# Patient Record
Sex: Female | Born: 1980 | Race: White | Hispanic: No | Marital: Single | Smoking: Never smoker
Health system: Southern US, Community
[De-identification: ages and names within clinical notes are randomized; demographics above are authoritative.]

## PROBLEM LIST (undated history)

## (undated) DIAGNOSIS — N200 Calculus of kidney: Secondary | ICD-10-CM

## (undated) DIAGNOSIS — G43909 Migraine, unspecified, not intractable, without status migrainosus: Secondary | ICD-10-CM

## (undated) HISTORY — PX: TUBAL LIGATION: SHX77

## (undated) HISTORY — PX: BREAST SURGERY: SHX581

---
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
## 2001-11-27 ENCOUNTER — Ambulatory Visit (HOSPITAL_COMMUNITY): Admission: RE | Admit: 2001-11-27 | Discharge: 2001-11-27 | Payer: Self-pay | Admitting: *Deleted

## 2001-11-27 ENCOUNTER — Encounter: Payer: Self-pay | Admitting: *Deleted

## 2001-12-30 ENCOUNTER — Ambulatory Visit (HOSPITAL_COMMUNITY): Admission: RE | Admit: 2001-12-30 | Discharge: 2001-12-31 | Payer: Self-pay | Admitting: *Deleted

## 2002-01-30 ENCOUNTER — Inpatient Hospital Stay (HOSPITAL_COMMUNITY): Admission: AD | Admit: 2002-01-30 | Discharge: 2002-02-02 | Payer: Self-pay | Admitting: *Deleted

## 2002-03-08 ENCOUNTER — Ambulatory Visit (HOSPITAL_COMMUNITY): Admission: RE | Admit: 2002-03-08 | Discharge: 2002-03-08 | Payer: Self-pay | Admitting: *Deleted

## 2002-05-13 ENCOUNTER — Encounter: Payer: Self-pay | Admitting: *Deleted

## 2002-05-13 ENCOUNTER — Emergency Department (HOSPITAL_COMMUNITY): Admission: EM | Admit: 2002-05-13 | Discharge: 2002-05-13 | Payer: Self-pay | Admitting: *Deleted

## 2002-07-19 ENCOUNTER — Encounter: Payer: Self-pay | Admitting: Emergency Medicine

## 2002-07-19 ENCOUNTER — Emergency Department (HOSPITAL_COMMUNITY): Admission: EM | Admit: 2002-07-19 | Discharge: 2002-07-19 | Payer: Self-pay | Admitting: Emergency Medicine

## 2002-09-26 ENCOUNTER — Emergency Department (HOSPITAL_COMMUNITY): Admission: EM | Admit: 2002-09-26 | Discharge: 2002-09-27 | Payer: Self-pay | Admitting: Emergency Medicine

## 2002-12-01 ENCOUNTER — Emergency Department (HOSPITAL_COMMUNITY): Admission: EM | Admit: 2002-12-01 | Discharge: 2002-12-01 | Payer: Self-pay | Admitting: *Deleted

## 2003-06-04 ENCOUNTER — Emergency Department (HOSPITAL_COMMUNITY): Admission: EM | Admit: 2003-06-04 | Discharge: 2003-06-05 | Payer: Self-pay | Admitting: *Deleted

## 2003-06-05 ENCOUNTER — Encounter: Payer: Self-pay | Admitting: *Deleted

## 2003-06-27 ENCOUNTER — Emergency Department (HOSPITAL_COMMUNITY): Admission: EM | Admit: 2003-06-27 | Discharge: 2003-06-27 | Payer: Self-pay | Admitting: Emergency Medicine

## 2004-06-04 ENCOUNTER — Emergency Department (HOSPITAL_COMMUNITY): Admission: EM | Admit: 2004-06-04 | Discharge: 2004-06-04 | Payer: Self-pay | Admitting: Family Medicine

## 2005-06-23 ENCOUNTER — Emergency Department (HOSPITAL_COMMUNITY): Admission: EM | Admit: 2005-06-23 | Discharge: 2005-06-23 | Payer: Self-pay | Admitting: Emergency Medicine

## 2008-07-28 ENCOUNTER — Emergency Department (HOSPITAL_COMMUNITY): Admission: EM | Admit: 2008-07-28 | Discharge: 2008-07-28 | Payer: Self-pay | Admitting: Family Medicine

## 2011-01-15 NOTE — H&P (Signed)
[facility]  Patient:    [name] Koch, [name] Koch Visit Number: [id number] MRN: [id number]          Service Type: DSU Location: DAY Attending Physician:  Jeri Cos. Dictated by:   Langley Gauss, M.D. Admit Date:  03/08/2002 Discharge Date: 03/08/2002                           History and Physical  HISTORY OF PRESENT ILLNESS:  This is a 30 year old, gravida 2, para 2, who adamantly desires permanent sterilization.  She accepts the 2% inherent failure rate associated with the procedure.  She also understands that it is considered to be considered an irreversible procedure.  The patient also understands that there are other reversible forms of birth control available but she desires permanent sterilization at this time.  PAST MEDICAL HISTORY:  She has no significant prior medical history.  PAST SURGICAL HISTORY:  The patient does have a history of a kidney stone. She does have a prior history of anemia.  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  ALLERGIES:  She has no known drug allergies.  PHYSICAL EXAMINATION:  VITAL SIGNS:  Height is 5 foot 4.  Weight is 134 pounds.  Blood pressure 116/63, pulse rate of 80, respiratory rate 20.  HEENT:  Negative.  NECK:  No adenopathy.  Neck is supple.  Thyroids are not palpable.  LUNGS:  Clear.  CARDIOVASCULAR:  Regular rate and rhythm.  ABDOMEN:  Soft and nontender.  No surgical scars are identified.  EXTREMITIES:  Normal.  PELVIC:  Normal external genitalia.  No lesions or ulcerations identified. Cervix is noted to be without lesions.  Bimanual examination reveals anteflex normal size uterus with no adnexal masses.  No significant pelvic tenderness on examination.  ASSESSMENT:  The patient desires permanent sterilization.  PLAN:  We will proceed with laparoscopic tubal ligation utilizing bipolar cautery on March 08, 2002. Dictated by:   Langley Gauss, M.D. Attending Physician:   Jeri Cos. DD:  03/09/02 TD:  03/12/02 Job: [id number]

## 2011-01-15 NOTE — Discharge Summary (Signed)
[facility]  Patient:    [name] Koch, [name] Koch Visit Number: [id number] MRN: [id number]          Service Type: MED Location: 4A A419 01 Attending Physician:  Jeri Cos. Dictated by:   Langley Gauss, M.D. Admit Date:  01/30/2002 Discharge Date: 02/02/2002                             Discharge Summary  DATE OF EPIDURAL AND DELIVERY:  January 31, 2002  DISCHARGE INSTRUCTIONS:  The patient is given a copy of standard discharge instructions at time of discharge.  FOLLOWUP:  She will follow up in the office in 4 weeks time.  She is interested in discussing permanent sterilization at that time.  PERTINENT LABORATORY STUDIES:  Pertinent laboratory studies include admission hemoglobin and hematocrit of 10/29; postpartum day #1-1/2 - 8.9/26.  HOSPITAL COURSE:  See previous dictations.  The patient delivered on January 31, 2002.  She had an epidural catheter in place at time of delivery.  Delivery itself was uncomplicated.  The patient did well postpartum, bonded well with the infant, thus mother and infant were discharged to home on postpartum day #1-1/2.  Of note, the patient discharged on postpartum day #1-1/2 in my absence according to crosscovered arrangement with Dr. Christin Bach. Dictated by:   Langley Gauss, M.D. Attending Physician:  Jeri Cos. DD:  02/10/02 TD:  02/13/02 Job: [id number]

## 2011-01-15 NOTE — Op Note (Signed)
[facility]  Patient:    [name] Koch, [name] Koch Visit Number: [id number] MRN: [id number]          Service Type: DSU Location: DAY Attending Physician:  Jeri Cos. Dictated by:   Langley Gauss, M.D. Admit Date:  03/08/2002 Discharge Date: 03/08/2002                             Operative Report  PREOPERATIVE DIAGNOSIS:  Desires permanent sterilization.  POSTOPERATIVE DIAGNOSIS:  Desires permanent sterilization.  PROCEDURE PERFORMED:  Laparoscopic tubal ligation utilizing bipolar cautery.  SURGEON:  Langley Gauss, M.D.  ESTIMATED BLOOD LOSS:  Minimal.  COMPLICATIONS:  None.  SPECIMENS:  None.  FINDINGS AT THE TIME OF SURGERY:  Include a normal anatomy of the pelvis to include normal tubes and ovaries bilaterally. No adhesive disease encountered. There was moderate descent of the uterus, which on traction with the uterosacral ligaments descending to the hymenal ring.  SUMMARY:  Desire for permanent sterilization was confirmed with the patient prior to proceeding. The patient was taken to the operating room.  Vital signs were stable.  The patient underwent uncomplicated induction of general endotracheal anesthesia, after which time she was placed in the low lithotomy position.  She was prepped and draped in the normal manner utilizing Betadine solution.  A speculum was then placed within the vaginal vault. The anterior lip of the cervix was grasped with a single-tooth tenaculum.  A Hulka intrauterine manipulator was then passed through the endocervical os and grasped the cervix at 12 oclock.  The speculum and tenaculum were then removed.  A red rubber catheter was used to drain about 50 cc of clear yellow urine from the bladder.  I then changed to sterile gloves, and standing at the patients left side, a 1 cm vertical incision was made just inferior to the umbilicus, as well as a suprapubic horizontal incision.  The abdominal wall was then  elevated.  The Veress needle was then passed through the abdominal wall and into the intraperitoneal space.  Drop test confirmed proper intraperitoneal placement.  3 cc of carbon dioxide gas were then insufflated at a filling pressure of less than 12 mmHg.  After assurance of adequate pneumoperitoneum, the Veress needle was removed.  The abdominal wall was then elevated.  A 10 mm disposable trocar and sleeve were inserted through this subumbilical incision, angling toward the hila of the sacrum. This was done atraumatically.  Trocar was removed.  The scope was inserted, which confirmed a proper intraperitoneal placement with no apparent bowel or vascular injury. Under direct visualization, a 5 mm trocar was then inserted directly suprapubically.  This was likewise done atraumatically.  This allowed the Klepinger bipolar cauterization forceps to be passed through the suprapubic site.  Each of the tubes was verified by tracing them out to their fimbriated ends.  The fimbriae are identified.  The tubes and ovaries are normal in appearance.  Triple cauterization technique was then utilized with the most proximal being 1 cm from the tubouterine junction on each cauterization. Cautery was continued until extensive tubular destruction resulted, and the cauterizing current fell down to 0.  This then resulted in extensive tubular destruction bilaterally and no apparent bowel or vascular injury; thus, our instruments were removed, and the gas was allowed to escape.  The two incision sites were closed utilizing 3-0 Vicryl suture in a deep interrupted fashion through the fascia, followed by a superficial 3-0 Vicryl suture  to reapproximate the skin edges.  The patient tolerated the procedure very well. She was extubated and taken to the recovery room in stable condition.  No family members are immediately available to discuss operative findings.  DISPOSITION:  The patient was given a copy of  standardized discharge instructions.  Follow up in the office in one weeks time.  DISCHARGE MEDICATIONS:  Include Lortab 10/500, #30 with no refill.  PERTINENT LABORATORY STUDIES:  Include quantitative beta HCG is negative, hemoglobin 12.0, hematocrit 37.2, with a white count of 3.1.  SUMMARY:  The patient had an uncomplicated laparoscopic tubal ligation on March 08, 2002.  She remained afebrile postoperatively and was able to ambulate and void, tolerate a regular, general diet, and remained afebrile.  Thus, the patient was discharged to home on the date of service, March 08, 2002. Dictated by:   Langley Gauss, M.D. Attending Physician:  Jeri Cos. DD:  03/09/02 TD:  03/12/02 Job: [id number]

## 2011-01-15 NOTE — Op Note (Signed)
[facility]  Patient:    [name] Koch, [name] Koch Visit Number: [id number] MRN: [id number]          Service Type: MED Location: 4A A419 01 Attending Physician:  Jeri Cos. Dictated by:   Langley Gauss, M.D. Admit Date:  01/30/2002 Discharge Date: 02/02/2002                             Operative Report  DATE OF DELIVERY:  January 31, 2002  DELIVERY FORM: 1. Outlet vacuum extraction of viable vigorous female infant. 2. Midline episiotomy and repair.  DELIVERY PERFORMED BY:  Langley Gauss, M.D.  ESTIMATED BLOOD LOSS:  Less than 500 cc.  COMPLICATIONS:  None.  SPECIMENS:  Arterial cord gas and cord blood to pathology.  Placenta was examined and noted to be apparently intact with a three-vessel umbilical cord.  SUMMARY:  The patient progressed rapidly through the course of labor after placement of the epidural.  She reached complete dilatation, after which time she did have a strong urge to push.  She was placed in the dorsal lithotomy position.  She pushed well during the second stage of labor with descent of the vertex to the perineal floor.  With distention of the perineum, a small midline episiotomy was performed.  A total of 30 cc of 1% lidocaine were used as a supplemental analgesic in the midline of the perineal body.  After performance of the small midline episiotomy, the patient pushed very well. There was no need to proceed with a vacuum delivery as the patient did deliver spontaneously in a direct OA position over this midline episiotomy without extension.  Mouth and nares of the infant were bulb-suctioned of clear amniotic fluid.  Renewed expulsive efforts resulted in a spontaneous rotation to a left anterior shoulder position.  Gentle downward traction combined with renewed expulsive efforts resulted in delivery of the remainder of the infant without difficulty.  The umbilical cord was then milked towards the infant. Cord was doubly  clamped and cut and infant is handed to the awaiting nursing staff.  Arterial cord gas and cord blood were then obtained.  Gentle traction on the umbilical cord resulted in separation, which upon examination appears to be an intact placenta with associated three-vessel umbilical cord. Excellent uterine tone is achieved following delivery of the placenta.  The episiotomy is easily repaired utilizing 0 chromic in a running-locked fashion on the vaginal mucosa, followed by a two-layer closure of 0 chromic on the perineal body.  The patient tolerated the delivery very well.  She is taken out of the dorsal lithotomy position and rolled to her side, at which time the epidural catheter was removed, with the blue tip noted to be intact, the patient returned to the supine position and both mother and infant doing very well following delivery. Dictated by:   Langley Gauss, M.D. Attending Physician:  Jeri Cos. DD:  02/10/02 TD:  02/13/02 Job: [id number]

## 2011-01-15 NOTE — Op Note (Signed)
[facility]  Patient:    [name] Koch, [name] Koch Visit Number: [id number] MRN: [id number]          Service Type: MED Location: 4A A419 01 Attending Physician:  Jeri Cos. Dictated by:   Langley Gauss, M.D. Proc. Date: 01/31/02 Admit Date:  01/30/2002 Discharge Date: 02/02/2002                             Operative Report  PROCEDURE:  Epidural placement.  SURGEON:  Langley Gauss, M.D.  DESCRIPTION OF PROCEDURE:  Appropriate informed consent had been obtained.  We had discussed epidural analgesic during the prenatal course.  Continuous electronic fetal monitoring was performed.  The patient was placed in a seated position, at which time bony landmarks were identified.  The L3-4 interspace was chosen.  The patients back was sterilely prepped and draped in the usual manner utilizing the epidural kit.  The L3-4 interspace was chosen.  Lidocaine 1% plain 5 cc was injected at the midline of the L3-4 interspace to raise a small skin wheal.  The 17 gauge Tuohy Schliff was then utilized with loss of resistance with an air-filled glass syringe to properly atraumatically identify entry into the epidural space on the first attempt without difficulty.  Excellent loss of resistance was noted.  An initial test dose of 5 cc of 1.5% lidocaine post epinephrine was injected through the epidural needle.  No signs of CSF or intravascular injection were obtained.  Thus, the epidural catheter was inserted to a depth of 5 cm.  The epidural needle was removed.  The aspiration test was negative.  The second test dose with 2 cc of 1.5% lidocaine.  Again no signs of CSF or intravascular injection obtained. Thus, the patient was connected to the infusion pump containing a standard mixture.  She will be treated with a bolus of 10 cc followed by a continuous infusion rate of 14 cc/hr.  Upon return to the left lateral supine position, the patient is having tingling in the feet  with some heaviness with evidence of rapid setting up an appropriate epidural block for labor purposes. Dictated by:   Langley Gauss, M.D. Attending Physician:  Jeri Cos. DD:  02/10/02 TD:  02/13/02 Job: [id number]

## 2011-01-15 NOTE — H&P (Signed)
[facility]  Patient:    [name] Koch, [name] Koch Visit Number: [id number] MRN: [id number]          Service Type: MED Location: 4A A419 01 Attending Physician:  Jeri Cos. Dictated by:   Langley Gauss, M.D. Admit Date:  01/30/2002 Discharge Date: 02/02/2002                           History and Physical  HISTORY:  A 30 year old gravida 2, para 1, at 39-1/[redacted] weeks gestation, who presents to [facility] complaining of an increasing frequency of uterine contractions throughout the day, particularly within the past two hours.  She does state that they have been five minutes apart x 2 hours duration, of one minute intensity.  The patient also complains of increased discharge of mucus-like appearing fluid from the cervix, but denies any rupture of membranes or any vaginal bleeding.  PAST SURGICAL HISTORY:  Negative.  PAST MEDICAL HISTORY:  Negative.  ALLERGIES:  No known drug allergies.  OB HISTORY:  Pertinent for one vaginal delivery without complications.  She states that the labor and delivery with that previous pregnancy, she labored very quickly, and reached complete dilatation, after which time she delivered very quickly.  In fact, the delivery itself was precipitous, and nurse-controlled.  PHYSICAL EXAMINATION  GENERAL:  Noted to be in no acute distress.  VITAL SIGNS:  Height 5 feet 5 inches, weight 156 pounds, blood pressure 130/87, pulse 103, respirations 18, temperature 98.1 degrees.  HEENT:  Negative.  NODES:  No adenopathy.  NECK:  Supple.  Thyroid is not palpable.  LUNGS:  Clear.  CARDIOVASCULAR:  Regular rate and rhythm.  ABDOMEN:  Soft, nontender.  No surgical scars are identified, but the patient does have a gravid uterus with a fundal height measured at 38 cm.  She is vertex presentation by Leopolds maneuvers.  EXTREMITIES:  Noted to be normal, with only trace pretibial edema.  PELVIC:  Normal external  genitalia.  No lesions or ulcerations identified.  In addition, no vaginal bleeding or leakage of fluid is noted.  The pelvic examination reveals the cervix to be 3.0 cm dilated, 80% effaced, 0 station, with vertex presentation confirmed.  Vertex is noted to be very well applied to the cervix.  External fetal monitor reveals a reassuring fetal heart rate with a fetal heart rate baseline of 150, with accelerations noted.  No fetal heart rate decelerations are noted.  Uterine contractions are noted to be moderate in intensity occurring every three to five minutes.  ASSESSMENT:  Term intrauterine pregnancy presenting in the early stages of labor.  Pertinently the patient does have a history of rapid prior labor and delivery, with precipitous nurse-controlled delivery.  PLAN:  Thus at this point in time the patient is admitted in early labor. Following the admission process will proceed with an amniotomy.  The patient pertinently does desire the placement of an epidural during the labor course. Dictated by:   Langley Gauss, M.D. Attending Physician:  Jeri Cos. DD:  02/10/02 TD:  02/12/02 Job: [id number]

## 2011-03-18 ENCOUNTER — Emergency Department (HOSPITAL_COMMUNITY): Payer: Self-pay

## 2011-03-18 ENCOUNTER — Emergency Department (HOSPITAL_COMMUNITY)
Admission: EM | Admit: 2011-03-18 | Discharge: 2011-03-18 | Disposition: A | Discharge status: Home / Self Care | Payer: Self-pay | Attending: Emergency Medicine | Admitting: Emergency Medicine

## 2011-03-18 ENCOUNTER — Encounter: Payer: Self-pay | Admitting: Emergency Medicine

## 2011-03-18 DIAGNOSIS — N200 Calculus of kidney: Secondary | ICD-10-CM | POA: Insufficient documentation

## 2011-03-18 DIAGNOSIS — N390 Urinary tract infection, site not specified: Secondary | ICD-10-CM | POA: Insufficient documentation

## 2011-03-18 HISTORY — DX: Calculus of kidney: N20.0

## 2011-03-18 LAB — CBC
HCT: 41.6 % (ref 36.0–46.0)
MCH: 28.9 pg (ref 26.0–34.0)
MCV: 91 fL (ref 78.0–100.0)
RBC: 4.57 MIL/uL (ref 3.87–5.11)
WBC: 3.8 10*3/uL — ABNORMAL LOW (ref 4.0–10.5)

## 2011-03-18 LAB — URINALYSIS, ROUTINE W REFLEX MICROSCOPIC
Bilirubin Urine: NEGATIVE
Glucose, UA: NEGATIVE mg/dL
Hgb urine dipstick: NEGATIVE
Ketones, ur: NEGATIVE mg/dL
Nitrite: POSITIVE — AB
Protein, ur: NEGATIVE mg/dL
Specific Gravity, Urine: 1.01 (ref 1.005–1.030)
Urobilinogen, UA: 0.2 mg/dL (ref 0.0–1.0)
pH: 6 (ref 5.0–8.0)

## 2011-03-18 LAB — URINE MICROSCOPIC-ADD ON

## 2011-03-18 LAB — DIFFERENTIAL
Eosinophils Absolute: 0.2 10*3/uL (ref 0.0–0.7)
Eosinophils Relative: 6 % — ABNORMAL HIGH (ref 0–5)
Lymphocytes Relative: 33 % (ref 12–46)
Lymphs Abs: 1.3 10*3/uL (ref 0.7–4.0)
Monocytes Absolute: 0.3 10*3/uL (ref 0.1–1.0)

## 2011-03-18 LAB — BASIC METABOLIC PANEL
CO2: 27 mEq/L (ref 19–32)
Chloride: 105 mEq/L (ref 96–112)
Creatinine, Ser: 0.65 mg/dL (ref 0.50–1.10)
Glucose, Bld: 86 mg/dL (ref 70–99)
Sodium: 139 mEq/L (ref 135–145)

## 2011-03-18 MED ORDER — SODIUM CHLORIDE 0.9 % IV SOLN
Freq: Once | INTRAVENOUS | Status: AC
  Administered 2011-03-18: 09:00:00 via INTRAVENOUS

## 2011-03-18 MED ORDER — CEPHALEXIN 500 MG PO CAPS: 500.0000 mg | ORAL_CAPSULE | Freq: Four times a day (QID) | ORAL | Status: AC

## 2011-03-18 MED ORDER — HYDROMORPHONE HCL 1 MG/ML IJ SOLN
1.0000 mg | Freq: Once | INTRAMUSCULAR | Status: AC
  Administered 2011-03-18: 1 mg via INTRAVENOUS
  Filled 2011-03-18: qty 1

## 2011-03-18 MED ORDER — PROMETHAZINE HCL 25 MG PO TABS: 25.0000 mg | ORAL_TABLET | Freq: Four times a day (QID) | ORAL | Status: DC | PRN

## 2011-03-18 MED ORDER — ONDANSETRON HCL 4 MG/2ML IJ SOLN
4.0000 mg | Freq: Once | INTRAMUSCULAR | Status: AC
  Administered 2011-03-18: 4 mg via INTRAVENOUS
  Filled 2011-03-18: qty 2

## 2011-03-18 MED ORDER — HYDROCODONE-ACETAMINOPHEN 5-325 MG PO TABS
1.0000 | ORAL_TABLET | Freq: Four times a day (QID) | ORAL | Status: AC | PRN
Start: 2011-03-18 — End: 2011-03-28

## 2011-03-18 MED ORDER — KETOROLAC TROMETHAMINE 30 MG/ML IJ SOLN
30.0000 mg | Freq: Once | INTRAMUSCULAR | Status: AC
  Administered 2011-03-18: 30 mg via INTRAVENOUS
  Filled 2011-03-18: qty 1

## 2011-03-18 NOTE — ED Notes (Signed)
Pt states when she stood this am she had a sharp pain that radiates from her left lower back around to the right side. Pt states she has a hx of kidney stones.

## 2011-03-18 NOTE — ED Notes (Signed)
[name] Koch did the urine for this pt. At 10:18 am

## 2011-03-18 NOTE — ED Notes (Signed)
Patient back from Ct. Alert and oriented. Airway patent. Respirations even and nonlabored. No acute distress noted. Patient's pain and nausea reevaluated-patient states "My pain is a little better." "I'm not nauseated at all right now." Rates pain at a 5 out of 7. No verbalized requests given.

## 2011-03-18 NOTE — ED Notes (Signed)
Abd soft, non tender. Bsx4. No active vomiting noted.

## 2011-03-18 NOTE — ED Provider Notes (Signed)
History     Chief Complaint  Patient presents with  . Back Pain   HPI Comments: Pt reports h/o kidney stones, last 4 years ago, with same pain.  Patient is a 30 y.o. female presenting with back pain. The history is provided by the patient.  Back Pain  This is a recurrent problem. The current episode started 3 to 5 hours ago. The problem occurs constantly. The problem has not changed since onset.The pain is associated with no known injury. The pain is present in the lumbar spine (right flank). The pain is at a severity of 6/10. The pain is moderate. Pertinent negatives include no chest pain, no fever, no numbness, no headaches, no abdominal pain, no bowel incontinence, no perianal numbness, no bladder incontinence, no dysuria and no tingling.    Past Medical History  Diagnosis Date  . Kidney stone   . Kidney stone     Past Surgical History  Procedure Date  . Tubal ligation     History reviewed. No pertinent family history.  History  Substance Use Topics  . Smoking status: Never Smoker   . Smokeless tobacco: Not on file  . Alcohol Use: No    OB History    Grav Para Term Preterm Abortions TAB SAB Ect Mult Living                  Review of Systems  Constitutional: Negative for fever and fatigue.  HENT: Negative for congestion, sinus pressure and ear discharge.   Eyes: Negative for discharge.  Respiratory: Negative for cough.   Cardiovascular: Negative for chest pain.  Gastrointestinal: Negative for abdominal pain, diarrhea and bowel incontinence.  Genitourinary: Positive for flank pain. Negative for bladder incontinence, dysuria, frequency and hematuria.  Musculoskeletal: Positive for back pain.  Skin: Negative for rash.  Neurological: Negative for tingling, seizures, numbness and headaches.  Hematological: Negative.   Psychiatric/Behavioral: Negative for hallucinations.    Physical Exam  BP 121/67  Pulse 80  Temp(Src) 97.9 F (36.6 C) (Oral)  Resp 17  Ht 5'  5" (1.651 m)  Wt 135 lb (61.236 kg)  BMI 22.47 kg/m2  SpO2 100%  LMP 03/11/2011  Physical Exam  Constitutional: She is oriented to person, place, and time. She appears well-developed.  HENT:  Head: Normocephalic and atraumatic.  Eyes: Conjunctivae and EOM are normal. No scleral icterus.  Neck: Neck supple. No thyromegaly present.  Cardiovascular: Normal rate and regular rhythm.  Exam reveals no gallop and no friction rub.   No murmur heard. Pulmonary/Chest: No stridor. She has no wheezes. She has no rales. She exhibits no tenderness.  Abdominal: Soft. She exhibits no distension. There is no tenderness. There is no rebound.       Mild right CVA tenderness  Musculoskeletal: Normal range of motion. She exhibits no edema.       mild midline lumbar tenderness  Lymphadenopathy:    She has no cervical adenopathy.  Neurological: She is oriented to person, place, and time. Coordination normal.  Skin: No rash noted. No erythema.  Psychiatric: She has a normal mood and affect. Her behavior is normal.    ED Course  Procedures Results for orders placed during the hospital encounter of 03/18/11  URINALYSIS, ROUTINE W REFLEX MICROSCOPIC      Component Value Range   Color, Urine YELLOW  YELLOW    Appearance CLEAR  CLEAR    Specific Gravity, Urine 1.010  1.005 - 1.030    pH 6.0  5.0 -  8.0    Glucose, UA NEGATIVE  NEGATIVE (mg/dL)   Hgb urine dipstick NEGATIVE  NEGATIVE    Bilirubin Urine NEGATIVE  NEGATIVE    Ketones, ur NEGATIVE  NEGATIVE (mg/dL)   Protein, ur NEGATIVE  NEGATIVE (mg/dL)   Urobilinogen, UA 0.2  0.0 - 1.0 (mg/dL)   Nitrite POSITIVE (*) NEGATIVE    Leukocytes, UA TRACE (*) NEGATIVE   BASIC METABOLIC PANEL      Component Value Range   Sodium 139  135 - 145 (mEq/L)   Potassium 4.1  3.5 - 5.1 (mEq/L)   Chloride 105  96 - 112 (mEq/L)   CO2 27  19 - 32 (mEq/L)   Glucose, Bld 86  70 - 99 (mg/dL)   BUN 12  6 - 23 (mg/dL)   Creatinine, Ser 1.61  0.50 - 1.10 (mg/dL)   Calcium  09.6 (*) 8.4 - 10.5 (mg/dL)   GFR calc non Af Amer >60  >60 (mL/min)   GFR calc Af Amer >60  >60 (mL/min)  CBC      Component Value Range   WBC 3.8 (*) 4.0 - 10.5 (K/uL)   RBC 4.57  3.87 - 5.11 (MIL/uL)   Hemoglobin 13.2  12.0 - 15.0 (g/dL)   HCT 04.5  40.9 - 81.1 (%)   MCV 91.0  78.0 - 100.0 (fL)   MCH 28.9  26.0 - 34.0 (pg)   MCHC 31.7  30.0 - 36.0 (g/dL)   RDW 91.4  78.2 - 95.6 (%)   Platelets 197  150 - 400 (K/uL)  DIFFERENTIAL      Component Value Range   Neutrophils Relative 52  43 - 77 (%)   Neutro Abs 2.0  1.7 - 7.7 (K/uL)   Lymphocytes Relative 33  12 - 46 (%)   Lymphs Abs 1.3  0.7 - 4.0 (K/uL)   Monocytes Relative 8  3 - 12 (%)   Monocytes Absolute 0.3  0.1 - 1.0 (K/uL)   Eosinophils Relative 6 (*) 0 - 5 (%)   Eosinophils Absolute 0.2  0.0 - 0.7 (K/uL)   Basophils Relative 1  0 - 1 (%)   Basophils Absolute 0.1  0.0 - 0.1 (K/uL)  POCT PREGNANCY, URINE      Component Value Range   Preg Test, Ur NEGATIVE    URINE MICROSCOPIC-ADD ON      Component Value Range   Squamous Epithelial / LPF FEW (*) RARE    WBC, UA 3-6  <3 (WBC/hpf)   Bacteria, UA MANY (*) RARE     Ct Abdomen Pelvis Wo Contrast  03/18/2011  *RADIOLOGY REPORT*  Clinical Data: Bilateral flank pain left greater than right, history kidney stones, tubal ligation  CT ABDOMEN AND PELVIS WITHOUT CONTRAST  Technique:  Multidetector CT imaging of the abdomen and pelvis was performed following the standard protocol without intravenous contrast. Sagittal and coronal MPR images reconstructed from axial data set.  Comparison: None  Findings: Lung bases clear. Tiny nonobstructing calculus upper pole right kidney. Large 8 x 6 x 10 mm calculus lower pole left kidney. No hydronephrosis, ureteral dilatation or ureteral calcification. Bladder decompressed, unremarkable. Solid organs otherwise unremarkable for exam lacking IV and oral contrast.  Normal appendix. Tiny cyst left ovary 2.0 x 1.7 cm image 74. Stomach and bowel loops  grossly normal appearance for technique, with stool scattered throughout colon. No mass, adenopathy, free fluid or inflammatory process. Osseous structures unremarkable.  IMPRESSION: Bilateral nonobstructing renal calculi. Tiny left ovarian cyst. Otherwise normal exam.  Original Report Authenticated By: Lollie Marrow, M.D.    MDM Back pain.  Results discused with pt Written by Enos Fling acting as scribe for Benny Lennert, MD.   I personally performed the services described in this documentation, which was scribed in my presence. The recorded information has been reviewed and considered.   Benny Lennert, MD 03/18/11 1130

## 2011-03-22 LAB — URINE CULTURE
Colony Count: >=100000
Culture  Setup Time: 201207202154

## 2011-03-23 NOTE — ED Notes (Signed)
+   Urine Patient treated with keflex-sensitive to same-chart appended per protocol MD. 

## 2011-04-18 ENCOUNTER — Encounter (HOSPITAL_COMMUNITY): Payer: Self-pay | Admitting: *Deleted

## 2011-04-18 ENCOUNTER — Emergency Department (HOSPITAL_COMMUNITY): Payer: Self-pay

## 2011-04-18 ENCOUNTER — Emergency Department (HOSPITAL_COMMUNITY)
Admission: EM | Admit: 2011-04-18 | Discharge: 2011-04-18 | Disposition: A | Discharge status: Home / Self Care | Payer: Self-pay | Attending: Emergency Medicine | Admitting: Emergency Medicine

## 2011-04-18 DIAGNOSIS — N12 Tubulo-interstitial nephritis, not specified as acute or chronic: Secondary | ICD-10-CM | POA: Insufficient documentation

## 2011-04-18 DIAGNOSIS — N200 Calculus of kidney: Secondary | ICD-10-CM | POA: Insufficient documentation

## 2011-04-18 LAB — URINE MICROSCOPIC-ADD ON

## 2011-04-18 LAB — URINALYSIS, ROUTINE W REFLEX MICROSCOPIC
Glucose, UA: NEGATIVE mg/dL
Hgb urine dipstick: NEGATIVE
Ketones, ur: NEGATIVE mg/dL
pH: 6 (ref 5.0–8.0)

## 2011-04-18 LAB — BASIC METABOLIC PANEL
BUN: 9 mg/dL (ref 6–23)
CO2: 27 mEq/L (ref 19–32)
Chloride: 101 mEq/L (ref 96–112)
GFR calc Af Amer: >60 mL/min (ref >60)
Potassium: 3.6 mEq/L (ref 3.5–5.1)

## 2011-04-18 MED ORDER — CIPROFLOXACIN IN D5W 400 MG/200ML IV SOLN
400.0000 mg | Freq: Once | INTRAVENOUS | Status: AC
  Administered 2011-04-18: 400 mg via INTRAVENOUS
  Filled 2011-04-18: qty 200

## 2011-04-18 MED ORDER — ONDANSETRON HCL 4 MG PO TABS: 4.0000 mg | ORAL_TABLET | Freq: Four times a day (QID) | ORAL | Status: AC

## 2011-04-18 MED ORDER — KETOROLAC TROMETHAMINE 30 MG/ML IJ SOLN
30.0000 mg | Freq: Once | INTRAMUSCULAR | Status: AC
  Administered 2011-04-18: 30 mg via INTRAVENOUS
  Filled 2011-04-18: qty 1

## 2011-04-18 MED ORDER — CIPROFLOXACIN HCL 500 MG PO TABS: 500.0000 mg | ORAL_TABLET | Freq: Two times a day (BID) | ORAL | Status: AC

## 2011-04-18 MED ORDER — SODIUM CHLORIDE 0.9 % IV BOLUS (SEPSIS)
1000.0000 mL | Freq: Once | INTRAVENOUS | Status: AC
  Administered 2011-04-18: 1000 mL via INTRAVENOUS

## 2011-04-18 MED ORDER — OXYCODONE-ACETAMINOPHEN 5-325 MG PO TABS
1.0000 | ORAL_TABLET | Freq: Once | ORAL | Status: AC
Start: 2011-04-18 — End: 2011-04-18
  Administered 2011-04-18: 1 via ORAL
  Filled 2011-04-18: qty 1

## 2011-04-18 MED ORDER — OXYCODONE-ACETAMINOPHEN 5-325 MG PO TABS: 1.0000 | ORAL_TABLET | Freq: Four times a day (QID) | ORAL | Status: AC | PRN

## 2011-04-18 MED ORDER — ONDANSETRON HCL 4 MG/2ML IJ SOLN
4.0000 mg | Freq: Once | INTRAMUSCULAR | Status: AC
  Administered 2011-04-18: 4 mg via INTRAVENOUS
  Filled 2011-04-18: qty 2

## 2011-04-18 MED ORDER — HYDROMORPHONE HCL 1 MG/ML IJ SOLN
1.0000 mg | Freq: Once | INTRAMUSCULAR | Status: AC
  Administered 2011-04-18: 1 mg via INTRAVENOUS
  Filled 2011-04-18: qty 1

## 2011-04-18 NOTE — ED Provider Notes (Signed)
Scribed for Ethelda Chick, MD, the patient was seen in room APA12/APA12. This chart was scribed by Clarita Crane. This patient's care was started at 7:40AM.  History     CSN: [id number] Arrival date & time: 04/18/2011  7:19 AM  Chief Complaint  Patient presents with  . Flank Pain    right side   The history is provided by the patient.   [name] Koch is a 30 y.o. female who presents to the Emergency Department complaining of intermittent right flank pain with the onset occuring yesterday and associated symptoms of vomiting, loss of appetite, dysuria, nausea and vomiting. Denies fever, hematuria. Rates flank pain an 8/10 currently. States pain is moderately relieved with use of pain medications and aggravated by palpation. Patient also stated she has a history of kidney stones and was evaluated in ED by Dr. Estell Harpin approximately 1 month ago and was dx with kidney stone following Ct-Abdomen performed.    HPI ELEMENTS: Location: Right flank Onset: Yesterday Quality: Like previous kidney stones, sharp Severity: 8/10 Duration: Persistent since onset.  Timing: intermittent Modifying factors: Moderately relieved by pain medications. Aggravated by palpation. Context: as above  Associated symptoms: Vomiting, loss of appetite, blood in the urine and difficulty urinating. Patient denies experiencing any pain while urinating.  PAST MEDICAL HISTORY:  Past Medical History  Diagnosis Date  . Kidney stone   . Kidney stone     PAST SURGICAL HISTORY:  Past Surgical History  Procedure Date  . Tubal ligation   . Tubal ligation     MEDICATIONS:  Previous Medications   ACETAMINOPHEN (TYLENOL) 500 MG TABLET    Take 1,000 mg by mouth every 6 (six) hours as needed. For pain    DIPHENHYDRAMINE-ACETAMINOPHEN (TYLENOL PM) 25-500 MG TABS    Take 1 tablet by mouth at bedtime as needed. For sleep      ALLERGIES:  Allergies as of 04/18/2011  . (No Known Allergies)     FAMILY HISTORY:  No  family history on file.   SOCIAL HISTORY: History   Social History  . Marital Status: Single    Spouse Name: N/A    Number of Children: N/A  . Years of Education: N/A   Social History Main Topics  . Smoking status: Never Smoker   . Smokeless tobacco: None  . Alcohol Use: No  . Drug Use: No  . Sexually Active: Yes   Other Topics Concern  . None   Social History Narrative  . None     Review of Systems  10 Systems reviewed and are negative for acute change except as noted in the HPI.   Physical Exam  BP 120/69  Pulse 106  Temp(Src) 97.9 F (36.6 C) (Oral)  Resp 16  Ht 5\' 5"  (1.651 m)  Wt 135 lb (61.236 kg)  BMI 22.47 kg/m2  SpO2 96%  LMP 04/14/2011  Physical Exam  Nursing note and vitals reviewed. Constitutional: She is oriented to person, place, and time. She appears well-developed and well-nourished. No distress.  HENT:  Head: Normocephalic and atraumatic.       Mucous membranes dry.   Eyes: EOM are normal.  Neck: Neck supple.  Cardiovascular: Normal rate, regular rhythm and normal heart sounds.  Exam reveals no gallop and no friction rub.   No murmur heard. Pulmonary/Chest: Effort normal and breath sounds normal. She has no wheezes.  Abdominal: Soft. Bowel sounds are normal. She exhibits no distension. There is no tenderness.  Right CVA tenderness.  Musculoskeletal: Normal range of motion.  Neurological: She is alert and oriented to person, place, and time. No sensory deficit.  Skin: Skin is warm and dry.  Psychiatric: She has a normal mood and affect. Her behavior is normal.    ED Course  Procedures  OTHER DATA REVIEWED: Nursing notes, vital signs, and past medical records reviewed. Lab results reviewed and considered Imaging results reviewed and considered Previous medical records reviewed and considered--- Patient evaluated by Dr. Estell Harpin on 03/18/2011 and dx with kidney stone after having Ct-Abdomen performed. CT-Abdomen report from 03/18/2011  read by Dr. Tyron Russell notes patient with bilateral non-obstructing renal caliculi.   DIAGNOSTIC STUDIES: Oxygen Saturation is 96% on room air, normal by my interpretation.    LABS / RADIOLOGY: Results for orders placed during the hospital encounter of 04/18/11  URINALYSIS, ROUTINE W REFLEX MICROSCOPIC      Component Value Range   Color, Urine YELLOW  YELLOW    Appearance HAZY (*) CLEAR    Specific Gravity, Urine 1.025  1.005 - 1.030    pH 6.0  5.0 - 8.0    Glucose, UA NEGATIVE  NEGATIVE (mg/dL)   Hgb urine dipstick NEGATIVE  NEGATIVE    Bilirubin Urine SMALL (*) NEGATIVE    Ketones, ur NEGATIVE  NEGATIVE (mg/dL)   Protein, ur NEGATIVE  NEGATIVE (mg/dL)   Urobilinogen, UA 0.2  0.0 - 1.0 (mg/dL)   Nitrite POSITIVE (*) NEGATIVE    Leukocytes, UA SMALL (*) NEGATIVE   PREGNANCY, URINE      Component Value Range   Preg Test, Ur NEGATIVE    BASIC METABOLIC PANEL      Component Value Range   Sodium 137  135 - 145 (mEq/L)   Potassium 3.6  3.5 - 5.1 (mEq/L)   Chloride 101  96 - 112 (mEq/L)   CO2 27  19 - 32 (mEq/L)   Glucose, Bld 102 (*) 70 - 99 (mg/dL)   BUN 9  6 - 23 (mg/dL)   Creatinine, Ser 1.61  0.50 - 1.10 (mg/dL)   Calcium 09.6 (*) 8.4 - 10.5 (mg/dL)   GFR calc non Af Amer >60  >60 (mL/min)   GFR calc Af Amer >60  >60 (mL/min)  URINE MICROSCOPIC-ADD ON      Component Value Range   Squamous Epithelial / LPF MANY (*) RARE    WBC, UA 11-20  <3 (WBC/hpf)   RBC / HPF 3-6  <3 (RBC/hpf)   Bacteria, UA MANY (*) RARE    Crystals CA OXALATE CRYSTALS (*) NEGATIVE    Ct Abdomen Pelvis Wo Contrast  04/18/2011  *RADIOLOGY REPORT*  Clinical Data: Right flank pain  CT ABDOMEN AND PELVIS WITHOUT CONTRAST  Technique:  Multidetector CT imaging of the abdomen and pelvis was performed following the standard protocol without intravenous contrast.  Comparison: 03/18/2011  Findings: Stable left nephrolithiasis.  No hydronephrosis.  No ureteral calculus.  Stable appearance of the organs.  Normal  appendix.  Prominent stool in the rectosigmoid.  Uterus, adnexa, bladder are stable.  IMPRESSION: Stable.  Left nephrolithiasis.  Original Report Authenticated By: Donavan Burnet, M.D.    PROCEDURES:  ED COURSE / COORDINATION OF CARE: Orders Placed This Encounter  Procedures  . CT Abdomen Pelvis Wo Contrast  . Urinalysis with microscopic  . Pregnancy, urine  . Basic metabolic panel  . Urine microscopic-add on  . Saline lock IV     MDM: Differential Diagnosis: pt with right flank pain, vomiting, dysuria.  Urine shows + nitrite, WBCs- less likely to be c/w ureteral stone- however patient states pain feels simlar to prior stones.  CT scan reveals nephrolithiasis, but no ureteral stone.  Pt started on cipro for UTI/pyelo.  Pain, nausea controlled.  Will treat for pyelonephritis.  Urine cx from previous visit reviewed and sensitive to cipro.    PLAN: Pt discharged to continue ciprofloxacin, will also give pain control and nausea meds.  Pt referred to Dr. Jerre Simon and her PMD.  Discharged with strict return precautions.  Pt agreeable with plan. The patient is to return the emergency department if there is any worsening of symptoms. I have reviewed the discharge instructions with the patient/family  CONDITION ON DISCHARGE: stable   MEDICATIONS GIVEN IN THE E.D.  Medications  ciprofloxacin (CIPRO) IVPB 400 mg (400 mg Intravenous Given 04/18/11 0817)  acetaminophen (TYLENOL) 500 MG tablet (not administered)  diphenhydramine-acetaminophen (TYLENOL PM) 25-500 MG TABS (not administered)  HYDROmorphone (DILAUDID) injection 1 mg (1 mg Intravenous Given 04/18/11 0810)  ondansetron (ZOFRAN) injection 4 mg (4 mg Intravenous Given 04/18/11 0809)  sodium chloride 0.9 % bolus 1,000 mL (1000 mL Intravenous Given 04/18/11 0809)      I personally performed the services described in this documentation, which was scribed in my presence. The recorded information has been reviewed and considered. Ethelda Chick, MD    Ethelda Chick, MD 04/18/11 [phone]

## 2011-04-18 NOTE — ED Notes (Signed)
Dr. Linker at bedside  

## 2011-04-18 NOTE — ED Notes (Signed)
Patient with no complaints at this time. Respirations even and unlabored. Skin warm/dry. Discharge instructions reviewed with patient at this time. Patient given opportunity to voice concerns/ask questions. IV removed per policy and band-aid applied to site. Patient discharged at this time and left Emergency Department with steady gait.  

## 2011-04-18 NOTE — ED Notes (Signed)
Pt states she has had kidney stones about a month ago  And was told she had multiple stones.

## 2011-05-23 ENCOUNTER — Emergency Department (HOSPITAL_COMMUNITY): Payer: Self-pay

## 2011-05-23 ENCOUNTER — Encounter (HOSPITAL_COMMUNITY): Payer: Self-pay | Admitting: Emergency Medicine

## 2011-05-23 ENCOUNTER — Emergency Department (HOSPITAL_COMMUNITY)
Admission: EM | Admit: 2011-05-23 | Discharge: 2011-05-23 | Disposition: A | Discharge status: Home / Self Care | Payer: Self-pay | Attending: Emergency Medicine | Admitting: Emergency Medicine

## 2011-05-23 DIAGNOSIS — N390 Urinary tract infection, site not specified: Secondary | ICD-10-CM

## 2011-05-23 DIAGNOSIS — R110 Nausea: Secondary | ICD-10-CM | POA: Insufficient documentation

## 2011-05-23 DIAGNOSIS — R1032 Left lower quadrant pain: Secondary | ICD-10-CM | POA: Insufficient documentation

## 2011-05-23 DIAGNOSIS — R109 Unspecified abdominal pain: Secondary | ICD-10-CM

## 2011-05-23 DIAGNOSIS — Z87442 Personal history of urinary calculi: Secondary | ICD-10-CM | POA: Insufficient documentation

## 2011-05-23 DIAGNOSIS — M549 Dorsalgia, unspecified: Secondary | ICD-10-CM | POA: Insufficient documentation

## 2011-05-23 DIAGNOSIS — Z9851 Tubal ligation status: Secondary | ICD-10-CM | POA: Insufficient documentation

## 2011-05-23 LAB — URINALYSIS, ROUTINE W REFLEX MICROSCOPIC
Bilirubin Urine: NEGATIVE
Glucose, UA: NEGATIVE mg/dL
Nitrite: NEGATIVE
Protein, ur: NEGATIVE mg/dL

## 2011-05-23 LAB — URINE MICROSCOPIC-ADD ON

## 2011-05-23 LAB — CBC
HCT: 41 % (ref 36.0–46.0)
MCH: 29.5 pg (ref 26.0–34.0)
MCHC: 32.7 g/dL (ref 30.0–36.0)
RDW: 13.4 % (ref 11.5–15.5)

## 2011-05-23 LAB — BASIC METABOLIC PANEL
BUN: 12 mg/dL (ref 6–23)
Calcium: 10.5 mg/dL (ref 8.4–10.5)
GFR calc Af Amer: >60 mL/min (ref >60)
GFR calc non Af Amer: >60 mL/min (ref >60)
Potassium: 3.9 mEq/L (ref 3.5–5.1)

## 2011-05-23 MED ORDER — HYDROMORPHONE HCL 1 MG/ML IJ SOLN
1.0000 mg | Freq: Once | INTRAMUSCULAR | Status: AC
  Administered 2011-05-23: 1 mg via INTRAVENOUS
  Filled 2011-05-23: qty 1

## 2011-05-23 MED ORDER — CIPROFLOXACIN HCL 500 MG PO TABS: 500.0000 mg | ORAL_TABLET | Freq: Two times a day (BID) | ORAL | Status: AC

## 2011-05-23 MED ORDER — CIPROFLOXACIN IN D5W 400 MG/200ML IV SOLN
400.0000 mg | Freq: Once | INTRAVENOUS | Status: AC
  Administered 2011-05-23: 400 mg via INTRAVENOUS
  Filled 2011-05-23: qty 200

## 2011-05-23 MED ORDER — HYDROCODONE-ACETAMINOPHEN 5-325 MG PO TABS: 1.0000 | ORAL_TABLET | ORAL | Status: AC | PRN

## 2011-05-23 MED ORDER — ONDANSETRON HCL 4 MG/2ML IJ SOLN
4.0000 mg | Freq: Once | INTRAMUSCULAR | Status: AC
  Administered 2011-05-23: 4 mg via INTRAVENOUS
  Filled 2011-05-23: qty 2

## 2011-05-23 MED ORDER — SODIUM CHLORIDE 0.9 % IV BOLUS (SEPSIS)
250.0000 mL | Freq: Once | INTRAVENOUS | Status: AC
  Administered 2011-05-23: 1000 mL via INTRAVENOUS

## 2011-05-23 MED ORDER — SODIUM CHLORIDE 0.9 % IV SOLN: INTRAVENOUS | Status: DC

## 2011-05-23 NOTE — ED Provider Notes (Signed)
Scribed for Shelda Jakes, MD, the patient was seen in room APA17/APA17. This chart was scribed by [employer]. The patient's care started at 09:03  CSN: [id number] Arrival date & time: 05/23/2011  9:03 AM  Chief Complaint  Patient presents with  . Abdominal Pain  . Nausea  . Back Pain    HPI  HPI [name] Koch is a 30 y.o. female who presents to the Emergency Department complaining of intermittent and worsening LLQ abdominal pain with associated back pain, onset Thursday at noon. States that pain reminds her of her history of kidney stones. Patient reports nausea this a.m but denies vomiting, fever, diarrhea, dysuria, dark urine, rash, headache, chest pain or trouble breathing. LNMP was a week ago  Past Medical History  Diagnosis Date  . Kidney stone   . Kidney stone     Past Surgical History  Procedure Date  . Tubal ligation   . Tubal ligation     History reviewed. No pertinent family history.  History  Substance Use Topics  . Smoking status: Never Smoker   . Smokeless tobacco: Not on file  . Alcohol Use: No    OB History    Grav Para Term Preterm Abortions TAB SAB Ect Mult Living                  Review of Systems  Review of Systems  Constitutional: Negative for fever.  Cardiovascular: Negative for chest pain.  Gastrointestinal: Positive for nausea and abdominal pain (LLQ). Negative for vomiting and diarrhea.  Musculoskeletal: Positive for back pain (mild).  Skin: Negative for rash.  Neurological: Negative for headaches.  All other systems reviewed and are negative.    Allergies  Review of patient's allergies indicates no known allergies.  Home Medications   Current Outpatient Rx  Name Route Sig Dispense Refill  . ACETAMINOPHEN 500 MG PO TABS Oral Take 1,000 mg by mouth every 6 (six) hours as needed. For pain     . DIPHENHYDRAMINE-APAP (SLEEP) 25-500 MG PO TABS Oral Take 1 tablet by mouth at bedtime as needed. For sleep       Physical  Exam    BP 126/75  Pulse 94  Temp(Src) 97.9 F (36.6 C) (Oral)  Resp 18  Ht 5\' 5"  (1.651 m)  Wt 130 lb (58.968 kg)  BMI 21.63 kg/m2  SpO2 100%  LMP 05/13/2011  Physical Exam  Nursing note and vitals reviewed. Constitutional: She is oriented to person, place, and time. She appears well-developed and well-nourished. No distress.  HENT:  Head: Normocephalic and atraumatic.  Right Ear: External ear normal.  Left Ear: External ear normal.  Mouth/Throat: Oropharynx is clear and moist. No oropharyngeal exudate.  Eyes: Conjunctivae and EOM are normal. Pupils are equal, round, and reactive to light. Right eye exhibits no discharge. Left eye exhibits no discharge. No scleral icterus.  Neck: Neck supple. No tracheal deviation present.  Cardiovascular: Normal rate, regular rhythm, normal heart sounds and intact distal pulses.   Pulmonary/Chest: Effort normal and breath sounds normal. No stridor. No respiratory distress. She has no wheezes. She has no rales.  Abdominal: Soft. Bowel sounds are normal. She exhibits no distension. There is no tenderness. There is no rebound and no guarding.  Musculoskeletal: She exhibits no edema and no tenderness.  Neurological: She is alert and oriented to person, place, and time. She has normal strength. No cranial nerve deficit ( no gross defecits noted) or sensory deficit. She exhibits normal muscle tone. She displays  no seizure activity. Coordination normal.  Skin: Skin is warm and dry. No rash noted. No erythema.  Psychiatric: She has a normal mood and affect. Her behavior is normal.    ED Course  Procedures   OTHER DATA REVIEWED: Nursing notes, vital signs, and past medical records reviewed.    DIAGNOSTIC STUDIES: Oxygen Saturation is 100% on room air, normal by my interpretation.    LABS / RADIOLOGY:  Ct Abdomen Pelvis Wo Contrast  05/23/2011  *RADIOLOGY REPORT*  Clinical Data: Left flank pain  CT ABDOMEN AND PELVIS WITHOUT CONTRAST  Technique:   Multidetector CT imaging of the abdomen and pelvis was performed following the standard protocol without intravenous contrast.  Comparison: 04/18/2011  Findings: Visualized lung bases clear.  9 mm calculus in the lower pole left renal collecting system.  2 mm calculus in the upper pole right renal collecting system.  No hydronephrosis, ureterectasis, or ureteral calculus.  Urinary bladder is incompletely distended.  Unremarkable uninfused evaluation of liver, spleen, gallbladder, adrenal glands, pancreas, abdominal aorta, stomach, small bowel. Normal appendix.  The colon is nondilated, unremarkable.  No ascites.  No free air.  Uterus and adnexal regions unremarkable.  IMPRESSION:  Bilateral nephrolithiasis without hydronephrosis or ureteral calculus.  Original Report Authenticated By: Osa Craver, M.D.   Results for orders placed during the hospital encounter of 05/23/11  URINALYSIS, ROUTINE W REFLEX MICROSCOPIC      Component Value Range   Color, Urine YELLOW  YELLOW    Appearance CLEAR  CLEAR    Specific Gravity, Urine 1.020  1.005 - 1.030    pH 6.5  5.0 - 8.0    Glucose, UA NEGATIVE  NEGATIVE (mg/dL)   Hgb urine dipstick NEGATIVE  NEGATIVE    Bilirubin Urine NEGATIVE  NEGATIVE    Ketones, ur TRACE (*) NEGATIVE (mg/dL)   Protein, ur NEGATIVE  NEGATIVE (mg/dL)   Urobilinogen, UA 0.2  0.0 - 1.0 (mg/dL)   Nitrite NEGATIVE  NEGATIVE    Leukocytes, UA TRACE (*) NEGATIVE   CBC      Component Value Range   WBC 6.3  4.0 - 10.5 (K/uL)   RBC 4.54  3.87 - 5.11 (MIL/uL)   Hemoglobin 13.4  12.0 - 15.0 (g/dL)   HCT 78.2  95.6 - 21.3 (%)   MCV 90.3  78.0 - 100.0 (fL)   MCH 29.5  26.0 - 34.0 (pg)   MCHC 32.7  30.0 - 36.0 (g/dL)   RDW 08.6  57.8 - 46.9 (%)   Platelets 200  150 - 400 (K/uL)  BASIC METABOLIC PANEL      Component Value Range   Sodium 137  135 - 145 (mEq/L)   Potassium 3.9  3.5 - 5.1 (mEq/L)   Chloride 103  96 - 112 (mEq/L)   CO2 24  19 - 32 (mEq/L)   Glucose, Bld 88  70 -  99 (mg/dL)   BUN 12  6 - 23 (mg/dL)   Creatinine, Ser 6.29  0.50 - 1.10 (mg/dL)   Calcium 52.8  8.4 - 10.5 (mg/dL)   GFR calc non Af Amer >60  >60 (mL/min)   GFR calc Af Amer >60  >60 (mL/min)  POCT PREGNANCY, URINE      Component Value Range   Preg Test, Ur NEGATIVE    URINE MICROSCOPIC-ADD ON      Component Value Range   Squamous Epithelial / LPF RARE  RARE    WBC, UA 7-10  <3 (WBC/hpf)   RBC /  HPF 3-6  <3 (RBC/hpf)   Bacteria, UA RARE  RARE       ED COURSE / COORDINATION OF CARE: 09:30 - EDMD examined patient and ordered the following Orders Placed This Encounter  Procedures  . CT Abdomen Pelvis Wo Contrast  . Urinalysis with microscopic  . CBC  . Basic metabolic panel  . Pregnancy, urine POC     MDM: BY STUDIES UTI WILL RX WITH CIPRO FIRST DOSE IN ED AND RX AT HOME WITH SAME CULTURE SENT.   IMPRESSION: Diagnoses that have been ruled out:  Diagnoses that are still under consideration:  Final diagnoses:    PLAN:  HOME  The patient is to return the emergency department if there is any worsening of symptoms. I have reviewed the discharge instructions with the PATIENT  CONDITION ON DISCHARGE: Good  MEDICATIONS GIVEN IN THE E.D. Medications - No data to display  DISCHARGE MEDICATIONS: New Prescriptions   No medications on file    SCRIBE ATTESTATION:  I personally performed the services described in this documentation, which was scribed in my presence. The recorded information has been reviewed and considered. Shelda Jakes, MD    Shelda Jakes, MD 05/23/11 [phone]

## 2011-05-23 NOTE — ED Notes (Signed)
Pt c/o llq abd pain and lower back pain that started Thursday. Pt denies any v/d, but states the nausea started this am.

## 2011-05-24 LAB — URINE CULTURE
Colony Count: NO GROWTH
Culture  Setup Time: 201209231956
Culture: NO GROWTH

## 2011-10-15 ENCOUNTER — Emergency Department (HOSPITAL_COMMUNITY): Payer: Self-pay

## 2011-10-15 ENCOUNTER — Encounter (HOSPITAL_COMMUNITY): Payer: Self-pay | Admitting: *Deleted

## 2011-10-15 ENCOUNTER — Other Ambulatory Visit: Payer: Self-pay

## 2011-10-15 ENCOUNTER — Emergency Department (HOSPITAL_COMMUNITY)
Admission: EM | Admit: 2011-10-15 | Discharge: 2011-10-15 | Disposition: A | Discharge status: Home / Self Care | Payer: Self-pay | Attending: Emergency Medicine | Admitting: Emergency Medicine

## 2011-10-15 DIAGNOSIS — R079 Chest pain, unspecified: Secondary | ICD-10-CM | POA: Insufficient documentation

## 2011-10-15 DIAGNOSIS — I498 Other specified cardiac arrhythmias: Secondary | ICD-10-CM | POA: Insufficient documentation

## 2011-10-15 DIAGNOSIS — Z9851 Tubal ligation status: Secondary | ICD-10-CM | POA: Insufficient documentation

## 2011-10-15 DIAGNOSIS — R059 Cough, unspecified: Secondary | ICD-10-CM | POA: Insufficient documentation

## 2011-10-15 DIAGNOSIS — R0602 Shortness of breath: Secondary | ICD-10-CM | POA: Insufficient documentation

## 2011-10-15 DIAGNOSIS — Z87442 Personal history of urinary calculi: Secondary | ICD-10-CM | POA: Insufficient documentation

## 2011-10-15 DIAGNOSIS — R05 Cough: Secondary | ICD-10-CM | POA: Insufficient documentation

## 2011-10-15 DIAGNOSIS — K2970 Gastritis, unspecified, without bleeding: Secondary | ICD-10-CM

## 2011-10-15 LAB — HEPATIC FUNCTION PANEL
ALT: 19 U/L (ref 0–35)
AST: 18 U/L (ref 0–37)
Albumin: 4.4 g/dL (ref 3.5–5.2)

## 2011-10-15 LAB — BASIC METABOLIC PANEL
Calcium: 11.2 mg/dL — ABNORMAL HIGH (ref 8.4–10.5)
GFR calc Af Amer: >90 mL/min (ref >90)
GFR calc non Af Amer: >90 mL/min (ref >90)
Potassium: 3.9 mEq/L (ref 3.5–5.1)
Sodium: 140 mEq/L (ref 135–145)

## 2011-10-15 LAB — CBC
Hemoglobin: 13.6 g/dL (ref 12.0–15.0)
MCH: 28.5 pg (ref 26.0–34.0)
MCHC: 32.5 g/dL (ref 30.0–36.0)
Platelets: 220 10*3/uL (ref 150–400)
RDW: 13.2 % (ref 11.5–15.5)

## 2011-10-15 LAB — D-DIMER, QUANTITATIVE: D-Dimer, Quant: 0.38 ug/mL-FEU (ref 0.00–0.48)

## 2011-10-15 LAB — POCT I-STAT TROPONIN I: Troponin i, poc: 0 ng/mL (ref 0.00–0.08)

## 2011-10-15 MED ORDER — FAMOTIDINE 20 MG PO TABS: 20.0000 mg | ORAL_TABLET | Freq: Two times a day (BID) | ORAL | Status: AC

## 2011-10-15 MED ORDER — HYDROMORPHONE HCL PF 1 MG/ML IJ SOLN
1.0000 mg | Freq: Once | INTRAMUSCULAR | Status: AC
  Administered 2011-10-15: 1 mg via INTRAVENOUS
  Filled 2011-10-15: qty 1

## 2011-10-15 MED ORDER — FAMOTIDINE IN NACL 20-0.9 MG/50ML-% IV SOLN
20.0000 mg | Freq: Once | INTRAVENOUS | Status: AC
  Administered 2011-10-15: 20 mg via INTRAVENOUS
  Filled 2011-10-15: qty 50

## 2011-10-15 MED ORDER — ONDANSETRON HCL 4 MG/2ML IJ SOLN
4.0000 mg | Freq: Once | INTRAMUSCULAR | Status: AC
  Administered 2011-10-15: 4 mg via INTRAVENOUS
  Filled 2011-10-15: qty 2

## 2011-10-15 MED ORDER — DIPHENHYDRAMINE HCL 50 MG/ML IJ SOLN
25.0000 mg | Freq: Once | INTRAMUSCULAR | Status: AC
  Administered 2011-10-15: 25 mg via INTRAVENOUS
  Filled 2011-10-15: qty 1

## 2011-10-15 MED ORDER — OXYCODONE-ACETAMINOPHEN 5-325 MG PO TABS: 2.0000 | ORAL_TABLET | ORAL | Status: AC | PRN

## 2011-10-15 NOTE — ED Provider Notes (Signed)
This chart was scribed for [name] Bonier, MD by [name] Koch. The patient was seen in room APA19/APA19 at 12:33 PM.  CSN: [id number]  Arrival date & time 10/15/11  1122   First MD Initiated Contact with Patient 10/15/11 1200      Chief Complaint  Patient presents with  . Chest Pain    (Consider location/radiation/quality/duration/timing/severity/associated sxs/prior treatment) Patient is a 31 y.o. female presenting with chest pain. The history is provided by the patient.  Chest Pain The chest pain began yesterday. Chest pain occurs intermittently. The chest pain is improving. The pain is associated with coughing. The severity of the pain is moderate. The quality of the pain is described as dull. Primary symptoms include shortness of breath and cough. Pertinent negatives for primary symptoms include no fever, no nausea and no vomiting.    [name] is a 31 y.o. female who presents to the Emergency Department complaining of acute onset intermittent chest pain that started yesterday. Pt has a dull pain in left chest and center of chest. Pt has some associated shortness of breath. She has no history of blot clot problems but her father and brother do. Past Medical History  Diagnosis Date  . Kidney stone   . Kidney stone     Past Surgical History  Procedure Date  . Tubal ligation   . Tubal ligation     No family history on file.  History  Substance Use Topics  . Smoking status: Never Smoker   . Smokeless tobacco: Not on file  . Alcohol Use: No    OB History    Grav Para Term Preterm Abortions TAB SAB Ect Mult Living                  Review of Systems  Constitutional: Negative for fever.  Respiratory: Positive for cough and shortness of breath.   Cardiovascular: Positive for chest pain.  Gastrointestinal: Negative for nausea and vomiting.   10 Systems reviewed and are negative for acute change except as noted in the HPI.  Allergies  Review of patient's  allergies indicates no known allergies.  Home Medications   Current Outpatient Rx  Name Route Sig Dispense Refill  . ACETAMINOPHEN 500 MG PO TABS Oral Take 1,000 mg by mouth every 6 (six) hours as needed. For pain     . DIPHENHYDRAMINE-APAP (SLEEP) 25-500 MG PO TABS Oral Take 1 tablet by mouth at bedtime as needed. For sleep       BP 119/70  Pulse 85  Temp(Src) 98.3 F (36.8 C) (Oral)  Resp 16  Ht 5\' 4"  (1.626 m)  Wt 130 lb (58.968 kg)  BMI 22.31 kg/m2  SpO2 100%  LMP 10/15/2011  Physical Exam  Nursing note and vitals reviewed. Constitutional: She is oriented to person, place, and time. She appears well-developed and well-nourished.       Woke up to pain yesterday  HENT:  Head: Normocephalic and atraumatic.  Mouth/Throat: Oropharynx is clear and moist.  Eyes: Conjunctivae are normal. Pupils are equal, round, and reactive to light. No scleral icterus.  Neck: Normal range of motion. Neck supple. No JVD present.  Cardiovascular: Normal rate, regular rhythm and normal heart sounds.  Exam reveals no gallop and no friction rub.   No murmur heard. Pulmonary/Chest: Effort normal and breath sounds normal. No respiratory distress. She has no wheezes. She has no rales.       Lung sounds clear in all fields  Abdominal: Soft. Bowel sounds  are normal. There is tenderness. There is no rebound and no guarding.       Tenderness in epigastrium  Neurological: She is alert and oriented to person, place, and time.  Skin: Skin is warm and dry.  Psychiatric: She has a normal mood and affect. Her behavior is normal.    ED Course  Procedures (including critical care time) DIAGNOSTIC STUDIES: Oxygen Saturation is 100% on room air, normal by my interpretation.    Date: 10/15/2011  Rate: 92  Rhythm: normal sinus rhythm and sinus arrhythmia  QRS Axis: normal  Intervals: normal  ST/T Wave abnormalities: normal  Conduction Disutrbances:none  Narrative Interpretation: Non-provocative EKG  Old  EKG Reviewed: none available   COORDINATION OF CARE:  Medications  HYDROmorphone (DILAUDID) injection 1 mg (not administered)  famotidine (PEPCID) IVPB 20 mg (not administered)  ondansetron (ZOFRAN) injection 4 mg (not administered)      Labs Reviewed  BASIC METABOLIC PANEL - Abnormal; Notable for the following:    Glucose, Bld 106 (*)    Calcium 11.2 (*)    All other components within normal limits  CBC  POCT I-STAT TROPONIN I   No results found.   No diagnosis found.    MDM  Musculoskeletal chest pain, costochondritis, GERD, Gastrointestinal Chest Pain, Pleuritic Chest Pain, Pneumonia, Pneumothorax, Pulmonary Embolism, Esophageal Spasm, Arrhythmia considered among other potential etiologies in the patient's differential diagnosis. ACS and AMI are not thought likely based on history and physical examination, lack of risk factors for cad.  3:38 PM At this time the patient is in no apparent distress with symptoms resolved, normal cardiac studies, normal chest x-ray, normal abdominal labs. Her physical examination and symptoms are consistent with gastritis. I will discharge her home her as an outpatient for same. The patient states her understanding of and agreement with the plan of care.      [name] Bonier, MD 10/15/11 1539

## 2011-10-15 NOTE — ED Notes (Signed)
Pt states she woke up with pain to chest yesterday. Pain described as intermittent, pressure to epigastric area and left chest. States productive cough this morning, yellow in color, streaked with blood. NAD.

## 2012-04-12 ENCOUNTER — Emergency Department (HOSPITAL_COMMUNITY): Payer: Self-pay

## 2012-04-12 ENCOUNTER — Emergency Department (HOSPITAL_COMMUNITY)
Admission: EM | Admit: 2012-04-12 | Discharge: 2012-04-12 | Disposition: A | Discharge status: Home / Self Care | Payer: Self-pay | Attending: Emergency Medicine | Admitting: Emergency Medicine

## 2012-04-12 ENCOUNTER — Encounter (HOSPITAL_COMMUNITY): Payer: Self-pay | Admitting: *Deleted

## 2012-04-12 DIAGNOSIS — R112 Nausea with vomiting, unspecified: Secondary | ICD-10-CM | POA: Insufficient documentation

## 2012-04-12 DIAGNOSIS — H53149 Visual discomfort, unspecified: Secondary | ICD-10-CM | POA: Insufficient documentation

## 2012-04-12 DIAGNOSIS — R51 Headache: Secondary | ICD-10-CM | POA: Insufficient documentation

## 2012-04-12 MED ORDER — KETOROLAC TROMETHAMINE 60 MG/2ML IM SOLN
60.0000 mg | Freq: Once | INTRAMUSCULAR | Status: AC
  Administered 2012-04-12: 60 mg via INTRAMUSCULAR
  Filled 2012-04-12: qty 2

## 2012-04-12 MED ORDER — MORPHINE SULFATE 4 MG/ML IJ SOLN
4.0000 mg | Freq: Once | INTRAMUSCULAR | Status: AC
  Administered 2012-04-12: 4 mg via INTRAMUSCULAR
  Filled 2012-04-12: qty 1

## 2012-04-12 MED ORDER — BUTALBITAL-APAP-CAFFEINE 50-325-40 MG PO TABS: 1.0000 | ORAL_TABLET | ORAL | Status: DC | PRN

## 2012-04-12 MED ORDER — METOCLOPRAMIDE HCL 5 MG/ML IJ SOLN
10.0000 mg | Freq: Once | INTRAMUSCULAR | Status: AC
  Administered 2012-04-12: 10 mg via INTRAMUSCULAR
  Filled 2012-04-12: qty 2

## 2012-04-12 MED ORDER — DIPHENHYDRAMINE HCL 25 MG PO CAPS
25.0000 mg | ORAL_CAPSULE | Freq: Once | ORAL | Status: AC
  Administered 2012-04-12: 25 mg via ORAL
  Filled 2012-04-12: qty 1

## 2012-04-12 NOTE — ED Notes (Signed)
Headache since yesterday, no injury.  Nausea, vomiting.

## 2012-04-15 NOTE — ED Provider Notes (Signed)
History     CSN: [id number]  Arrival date & time 04/12/12  1103   First MD Initiated Contact with Patient 04/12/12 1210      Chief Complaint  Patient presents with  . Headache    (Consider location/radiation/quality/duration/timing/severity/associated sxs/prior treatment) HPI Comments: Patient c/o headache of gradual onset that began on the day prior to ED arrival.  Describes a throbbing sensation to the right side of her head.  Also report N/V on the day of onset but none since.  States she has hx of headaches occasionally but this one has persisted longer than usual.  She denies visual changes, dizziness, fever or neck pain   Patient is a 31 y.o. female presenting with headaches. The history is provided by the patient.  Headache  This is a new problem. The current episode started yesterday. The problem occurs constantly. The problem has not changed since onset.The headache is associated with bright light. The pain is located in the right unilateral region. The quality of the pain is described as dull and throbbing. The pain is moderate. The pain does not radiate. Associated symptoms include nausea and vomiting. Pertinent negatives include no fever, no near-syncope, no palpitations, no syncope and no shortness of breath. She has tried acetaminophen and NSAIDs for the symptoms. The treatment provided no relief.    Past Medical History  Diagnosis Date  . Kidney stone   . Kidney stone     Past Surgical History  Procedure Date  . Tubal ligation   . Tubal ligation     History reviewed. No pertinent family history.  History  Substance Use Topics  . Smoking status: Never Smoker   . Smokeless tobacco: Not on file  . Alcohol Use: No    OB History    Grav Para Term Preterm Abortions TAB SAB Ect Mult Living                  Review of Systems  Constitutional: Negative for fever, activity change and appetite change.  HENT: Negative for facial swelling, trouble swallowing, neck  pain and neck stiffness.   Eyes: Positive for photophobia. Negative for pain and visual disturbance.  Respiratory: Negative for shortness of breath.   Cardiovascular: Negative for chest pain, palpitations, syncope and near-syncope.  Gastrointestinal: Positive for nausea and vomiting.  Skin: Negative for rash and wound.  Neurological: Positive for headaches. Negative for dizziness, facial asymmetry, speech difficulty, weakness and numbness.  Psychiatric/Behavioral: Negative for confusion and decreased concentration.  All other systems reviewed and are negative.    Allergies  Review of patient's allergies indicates no known allergies.  Home Medications   Current Outpatient Rx  Name Route Sig Dispense Refill  . ACETAMINOPHEN 500 MG PO TABS Oral Take 1,000 mg by mouth every 6 (six) hours as needed. For pain     . BUTALBITAL-APAP-CAFFEINE 50-325-40 MG PO TABS Oral Take 1-2 tablets by mouth every 4 (four) hours as needed for headache. 20 tablet 0  . DIPHENHYDRAMINE-APAP (SLEEP) 25-500 MG PO TABS Oral Take 1 tablet by mouth at bedtime as needed. For sleep       BP 107/72  Pulse 71  Temp 98.2 F (36.8 C) (Oral)  Resp 18  Ht 5\' 4"  (1.626 m)  Wt 150 lb (68.04 kg)  BMI 25.75 kg/m2  SpO2 98%  LMP 03/30/2012  Physical Exam  Nursing note and vitals reviewed. Constitutional: She is oriented to person, place, and time. She appears well-developed and well-nourished. No distress.  HENT:  Head: Normocephalic and atraumatic.  Mouth/Throat: Oropharynx is clear and moist.  Eyes: EOM are normal. Pupils are equal, round, and reactive to light.  Neck: Normal range of motion and phonation normal. Neck supple. No rigidity. No Brudzinski's sign and no Kernig's sign noted.  Cardiovascular: Normal rate, regular rhythm, normal heart sounds and intact distal pulses.   No murmur heard. Pulmonary/Chest: Effort normal and breath sounds normal.  Musculoskeletal: Normal range of motion.  Neurological: She  is alert and oriented to person, place, and time. No cranial nerve deficit or sensory deficit. She exhibits normal muscle tone. Coordination and gait normal.  Reflex Scores:      Tricep reflexes are 2+ on the right side and 2+ on the left side.      Bicep reflexes are 2+ on the right side and 2+ on the left side. Skin: Skin is warm and dry.    ED Course  Procedures (including critical care time)  Labs Reviewed - No data to display No results found.   1. Headache     Ct Head Wo Contrast  04/12/2012  *RADIOLOGY REPORT*  Clinical Data: Headache  CT HEAD WITHOUT CONTRAST  Technique:  Contiguous axial images were obtained from the base of the skull through the vertex without contrast.  Comparison: None.  Findings: There is no evidence for acute hemorrhage, hydrocephalus, mass lesion, or abnormal extra-axial fluid collection.  No definite CT evidence for acute infarction.  The visualized paranasal sinuses and mastoid air cells are clear.  IMPRESSION: Normal exam.  Original Report Authenticated By: ERIC A. MANSELL, M.D.    MDM    Vitals stable,  Pt is non-toxic appearing.  No focal neuro deficits, no meningeal signs.  Headache of gradual onset that is similar to previous.  No vomiting during ed stay.  Patient agrees to close f/u with PMD or to return here if the symptoms worsen.    Headache resolved in ED after medication.  Pt is ambulatory with a steady gait.  Agrees to close floow-up with her PMD or to return here if the sx's worsen  The patient appears reasonably screened and/or stabilized for discharge and I doubt any other medical condition or other [facility] requiring further screening, evaluation, or treatment in the ED at this time prior to discharge.    Prescribed:  fioricet  Loraine Bhullar L. [place], [place] 04/15/12 2207

## 2012-04-16 NOTE — ED Provider Notes (Signed)
Medical screening examination/treatment/procedure(s) were performed by non-physician practitioner and as supervising physician I was immediately available for consultation/collaboration.   Benny Lennert, MD 04/16/12 1235

## 2012-05-15 ENCOUNTER — Emergency Department (HOSPITAL_COMMUNITY): Payer: Self-pay

## 2012-05-15 ENCOUNTER — Emergency Department (HOSPITAL_COMMUNITY)
Admission: EM | Admit: 2012-05-15 | Discharge: 2012-05-15 | Disposition: A | Discharge status: Home / Self Care | Payer: Self-pay | Attending: Emergency Medicine | Admitting: Emergency Medicine

## 2012-05-15 ENCOUNTER — Encounter (HOSPITAL_COMMUNITY): Payer: Self-pay | Admitting: *Deleted

## 2012-05-15 DIAGNOSIS — Z87442 Personal history of urinary calculi: Secondary | ICD-10-CM | POA: Insufficient documentation

## 2012-05-15 DIAGNOSIS — R110 Nausea: Secondary | ICD-10-CM | POA: Insufficient documentation

## 2012-05-15 DIAGNOSIS — N12 Tubulo-interstitial nephritis, not specified as acute or chronic: Secondary | ICD-10-CM | POA: Insufficient documentation

## 2012-05-15 LAB — CBC WITH DIFFERENTIAL/PLATELET
Basophils Relative: 1 % (ref 0–1)
Eosinophils Absolute: 0.6 10*3/uL (ref 0.0–0.7)
Hemoglobin: 13.4 g/dL (ref 12.0–15.0)
MCH: 28.2 pg (ref 26.0–34.0)
MCHC: 32 g/dL (ref 30.0–36.0)
Monocytes Relative: 5 % (ref 3–12)
Neutrophils Relative %: 55 % (ref 43–77)

## 2012-05-15 LAB — URINALYSIS, ROUTINE W REFLEX MICROSCOPIC
Glucose, UA: NEGATIVE mg/dL
Ketones, ur: NEGATIVE mg/dL
Nitrite: NEGATIVE
Specific Gravity, Urine: 1.02 (ref 1.005–1.030)
pH: 6 (ref 5.0–8.0)
pH: 6 (ref 5.0–8.0)

## 2012-05-15 LAB — URINE MICROSCOPIC-ADD ON

## 2012-05-15 LAB — BASIC METABOLIC PANEL
BUN: 11 mg/dL (ref 6–23)
Creatinine, Ser: 0.67 mg/dL (ref 0.50–1.10)
GFR calc Af Amer: >90 mL/min (ref >90)
GFR calc non Af Amer: >90 mL/min (ref >90)
Potassium: 4 mEq/L (ref 3.5–5.1)

## 2012-05-15 LAB — PREGNANCY, URINE: Preg Test, Ur: NEGATIVE

## 2012-05-15 MED ORDER — MORPHINE SULFATE 4 MG/ML IJ SOLN
4.0000 mg | Freq: Once | INTRAMUSCULAR | Status: AC
  Administered 2012-05-15: 4 mg via INTRAVENOUS
  Filled 2012-05-15: qty 1

## 2012-05-15 MED ORDER — KETOROLAC TROMETHAMINE 30 MG/ML IJ SOLN
30.0000 mg | Freq: Once | INTRAMUSCULAR | Status: AC
  Administered 2012-05-15: 30 mg via INTRAMUSCULAR
  Filled 2012-05-15: qty 1

## 2012-05-15 MED ORDER — SULFAMETHOXAZOLE-TRIMETHOPRIM 800-160 MG PO TABS: 1.0000 | ORAL_TABLET | Freq: Two times a day (BID) | ORAL | Status: DC

## 2012-05-15 MED ORDER — SODIUM CHLORIDE 0.9 % IV BOLUS (SEPSIS)
1000.0000 mL | Freq: Once | INTRAVENOUS | Status: AC
  Administered 2012-05-15: 1000 mL via INTRAVENOUS

## 2012-05-15 MED ORDER — OXYCODONE-ACETAMINOPHEN 5-325 MG PO TABS
1.0000 | ORAL_TABLET | Freq: Once | ORAL | Status: AC
  Administered 2012-05-15: 1 via ORAL
  Filled 2012-05-15: qty 1

## 2012-05-15 MED ORDER — DEXTROSE 5 % IV SOLN
1.0000 g | Freq: Once | INTRAVENOUS | Status: AC
  Administered 2012-05-15: 1 g via INTRAVENOUS
  Filled 2012-05-15: qty 10

## 2012-05-15 MED ORDER — OXYCODONE-ACETAMINOPHEN 5-325 MG PO TABS: 1.0000 | ORAL_TABLET | Freq: Four times a day (QID) | ORAL | Status: DC | PRN

## 2012-05-15 NOTE — ED Notes (Signed)
Low back pain, say it feels like a kidney stone. Nausea, no vomiting.

## 2012-05-15 NOTE — ED Provider Notes (Signed)
History    This chart was scribed for [employer]. Rubin Payor, MD, MD by Smitty Pluck. The patient was seen in room APA04/APA04 and the patient's care was started at 4:46PM.   CSN: [id number]  Arrival date & time 05/15/12  1311       Chief Complaint  Patient presents with  . Back Pain    (Consider location/radiation/quality/duration/timing/severity/associated sxs/prior treatment) Patient is a 31 y.o. female presenting with back pain. The history is provided by the patient. No language interpreter was used.  Back Pain  Pertinent negatives include no fever, no dysuria and no weakness.   [name] Koch is a 31 y.o. female who presents to the Emergency Department complaining of moderate left lower back pain onset 1 day ago. Pt reports that she has hx of kidney stones (passed 6 stones in the past) and this feels similar. Pt reports having nausea and urinary urgency. She has taken tylenol and Advil without relief. Pt denies fever, vomiting, diarrhea and cough.   Past Medical History  Diagnosis Date  . Kidney stone   . Kidney stone     Past Surgical History  Procedure Date  . Tubal ligation   . Tubal ligation     History reviewed. No pertinent family history.  History  Substance Use Topics  . Smoking status: Never Smoker   . Smokeless tobacco: Not on file  . Alcohol Use: No    OB History    Grav Para Term Preterm Abortions TAB SAB Ect Mult Living                  Review of Systems  Constitutional: Negative for fever and chills.  Respiratory: Negative for shortness of breath.   Gastrointestinal: Positive for nausea. Negative for vomiting.  Genitourinary: Positive for urgency. Negative for dysuria and hematuria.  Musculoskeletal: Positive for back pain.  Neurological: Negative for weakness.    Allergies  Review of patient's allergies indicates no known allergies.  Home Medications   Current Outpatient Rx  Name Route Sig Dispense Refill  . ACETAMINOPHEN 500 MG PO  TABS Oral Take 1,000 mg by mouth every 6 (six) hours as needed. For pain     . DIPHENHYDRAMINE-APAP (SLEEP) 25-500 MG PO TABS Oral Take 1 tablet by mouth at bedtime as needed. For sleep     . IBUPROFEN 200 MG PO TABS Oral Take 600 mg by mouth every 8 (eight) hours as needed. pain    . OXYCODONE-ACETAMINOPHEN 5-325 MG PO TABS Oral Take 1-2 tablets by mouth every 6 (six) hours as needed for pain. 20 tablet 0  . SULFAMETHOXAZOLE-TRIMETHOPRIM 800-160 MG PO TABS Oral Take 1 tablet by mouth every 12 (twelve) hours. 14 tablet 0    BP 96/50  Pulse 83  Temp 98.2 F (36.8 C) (Oral)  Resp 20  Ht 5\' 5"  (1.651 m)  Wt 155 lb (70.308 kg)  BMI 25.79 kg/m2  SpO2 96%  LMP 04/14/2012  Physical Exam  Nursing note and vitals reviewed. Constitutional: She is oriented to person, place, and time. She appears well-developed and well-nourished.  HENT:  Head: Normocephalic and atraumatic.  Neck: Normal range of motion. Neck supple.  Cardiovascular: Normal rate, regular rhythm and normal heart sounds.   Pulmonary/Chest: Effort normal and breath sounds normal. No respiratory distress. She has no wheezes. She has no rales.  Abdominal: Soft. She exhibits no distension. There is no tenderness. There is no rebound and no CVA tenderness.  Musculoskeletal: Normal range of motion. She exhibits  no tenderness.  Neurological: She is alert and oriented to person, place, and time.  Skin: Skin is warm and dry. No rash noted.  Psychiatric: She has a normal mood and affect. Her behavior is normal.    ED Course  Procedures (including critical care time) DIAGNOSTIC STUDIES: Oxygen Saturation is 100% on room air, normal by my interpretation.    COORDINATION OF CARE: 4:51 PM Discussed pt ED treatment with pt  5:20 PM Ordered:   Medications  ibuprofen (ADVIL,MOTRIN) 200 MG tablet (not administered)  oxyCODONE-acetaminophen (PERCOCET/ROXICET) 5-325 MG per tablet (not administered)  sulfamethoxazole-trimethoprim (SEPTRA  DS) 800-160 MG per tablet (not administered)  oxyCODONE-acetaminophen (PERCOCET/ROXICET) 5-325 MG per tablet 1 tablet (1 tablet Oral Given 05/15/12 1657)  ketorolac (TORADOL) 30 MG/ML injection 30 mg (30 mg Intramuscular Given 05/15/12 1846)  oxyCODONE-acetaminophen (PERCOCET/ROXICET) 5-325 MG per tablet 1 tablet (1 tablet Oral Given 05/15/12 1847)  sodium chloride 0.9 % bolus 1,000 mL (1000 mL Intravenous Given 05/15/12 1955)  cefTRIAXone (ROCEPHIN) 1 g in dextrose 5 % 50 mL IVPB (1 g Intravenous Given 05/15/12 2023)  morphine 4 MG/ML injection 4 mg (4 mg Intravenous Given 05/15/12 2100)    Results for orders placed during the hospital encounter of 05/15/12  URINALYSIS, ROUTINE W REFLEX MICROSCOPIC      Component Value Range   Color, Urine YELLOW  YELLOW   APPearance CLEAR  CLEAR   Specific Gravity, Urine 1.015  1.005 - 1.030   pH 6.0  5.0 - 8.0   Glucose, UA NEGATIVE  NEGATIVE mg/dL   Hgb urine dipstick NEGATIVE  NEGATIVE   Bilirubin Urine NEGATIVE  NEGATIVE   Ketones, ur NEGATIVE  NEGATIVE mg/dL   Protein, ur NEGATIVE  NEGATIVE mg/dL   Urobilinogen, UA 0.2  0.0 - 1.0 mg/dL   Nitrite NEGATIVE  NEGATIVE   Leukocytes, UA SMALL (*) NEGATIVE  PREGNANCY, URINE      Component Value Range   Preg Test, Ur NEGATIVE  NEGATIVE  URINE MICROSCOPIC-ADD ON      Component Value Range   Squamous Epithelial / LPF MANY (*) RARE   WBC, UA 11-20  <3 WBC/hpf   RBC / HPF 7-10  <3 RBC/hpf   Bacteria, UA MANY (*) RARE  URINALYSIS, ROUTINE W REFLEX MICROSCOPIC      Component Value Range   Color, Urine YELLOW  YELLOW   APPearance CLEAR  CLEAR   Specific Gravity, Urine 1.020  1.005 - 1.030   pH 6.0  5.0 - 8.0   Glucose, UA NEGATIVE  NEGATIVE mg/dL   Hgb urine dipstick NEGATIVE  NEGATIVE   Bilirubin Urine NEGATIVE  NEGATIVE   Ketones, ur NEGATIVE  NEGATIVE mg/dL   Protein, ur NEGATIVE  NEGATIVE mg/dL   Urobilinogen, UA 0.2  0.0 - 1.0 mg/dL   Nitrite NEGATIVE  NEGATIVE   Leukocytes, UA TRACE (*) NEGATIVE   URINE MICROSCOPIC-ADD ON      Component Value Range   Squamous Epithelial / LPF FEW (*) RARE   WBC, UA 21-50  <3 WBC/hpf   RBC / HPF 3-6  <3 RBC/hpf   Bacteria, UA MANY (*) RARE  CBC WITH DIFFERENTIAL      Component Value Range   WBC 5.8  4.0 - 10.5 K/uL   RBC 4.76  3.87 - 5.11 MIL/uL   Hemoglobin 13.4  12.0 - 15.0 g/dL   HCT 16.1  09.6 - 04.5 %   MCV 88.0  78.0 - 100.0 fL   MCH 28.2  26.0 -  34.0 pg   MCHC 32.0  30.0 - 36.0 g/dL   RDW 40.9  81.1 - 91.4 %   Platelets 177  150 - 400 K/uL   Neutrophils Relative 55  43 - 77 %   Neutro Abs 3.2  1.7 - 7.7 K/uL   Lymphocytes Relative 28  12 - 46 %   Lymphs Abs 1.6  0.7 - 4.0 K/uL   Monocytes Relative 5  3 - 12 %   Monocytes Absolute 0.3  0.1 - 1.0 K/uL   Eosinophils Relative 11 (*) 0 - 5 %   Eosinophils Absolute 0.6  0.0 - 0.7 K/uL   Basophils Relative 1  0 - 1 %   Basophils Absolute 0.0  0.0 - 0.1 K/uL  BASIC METABOLIC PANEL      Component Value Range   Sodium 139  135 - 145 mEq/L   Potassium 4.0  3.5 - 5.1 mEq/L   Chloride 106  96 - 112 mEq/L   CO2 26  19 - 32 mEq/L   Glucose, Bld 85  70 - 99 mg/dL   BUN 11  6 - 23 mg/dL   Creatinine, Ser 7.82  0.50 - 1.10 mg/dL   Calcium 95.6  8.4 - 21.3 mg/dL   GFR calc non Af Amer >90  >90 mL/min   GFR calc Af Amer >90  >90 mL/min    6:06 PM Recheck: Discussed lab results and treatment course with pt.       Dg Abd 1 View  05/15/2012  *RADIOLOGY REPORT*  Clinical Data: Right flank pain for 2-3 days.  History of renal calculi.  ABDOMEN - 1 VIEW  Comparison:  CT 05/23/2011.  Findings: The bowel gas pattern is normal. Radiopaque density in the left upper quadrant approximately 9 ml in diameter is felt to represent a left lower pole renal calculus.  Right-sided stones not clearly seen.  Moderate stool burden. Negative osseous structures.  IMPRESSION: Left nephrolithiasis.  Constipation.   Original Report Authenticated By: Elsie Stain, M.D.    8:56 PM Recheck: Discussed lab results and  treatment course with pt. Pt is feeling better. Pt is ready for discharge.    1. Pyelonephritis       MDM  Patient with right flank pain. States it feels like a kidney stone. She's had nausea without vomiting. No clear fevers. She has had some urinary symptoms such as dysuria. No clear fevers. Urinalysis does show urinary tract infection. KUB does not show a clear right-sided distal stone, however does not image the previous proximal stone. Patient is overall well-appearing. She's had some continued pain. White count is normal. At this point I will treat it as a pyelonephritis. She'll followup with urology due to the previous kidney stone. Patient was instructed to return for intractable pain lightheadedness dizziness or worsening symptoms. I doubt it is an infected obstructed stone, however when he be followed.    I personally performed the services described in this documentation, which was scribed in my presence. The recorded information has been reviewed and considered.       Juliet Rude. Rubin Payor, MD 05/15/12 2144

## 2012-05-15 NOTE — ED Notes (Signed)
Pt wants something stronger for pain, EDP notified.

## 2012-05-15 NOTE — ED Notes (Signed)
Advised patient of cath.  Patient stated that pain medication did not ease the pain and she needed something additional for pain before we did cath.  Nurse notified

## 2012-05-15 NOTE — ED Notes (Signed)
R sided flank pain pt describes as same type of pain she had when she had a previous kidney stones. Also notes neasea and no vomiting.

## 2012-05-17 LAB — URINE CULTURE

## 2012-07-21 ENCOUNTER — Emergency Department (HOSPITAL_COMMUNITY): Payer: Self-pay

## 2012-07-21 ENCOUNTER — Encounter (HOSPITAL_COMMUNITY): Payer: Self-pay | Admitting: *Deleted

## 2012-07-21 ENCOUNTER — Emergency Department (HOSPITAL_COMMUNITY)
Admission: EM | Admit: 2012-07-21 | Discharge: 2012-07-21 | Disposition: A | Discharge status: Home / Self Care | Payer: Self-pay | Attending: Emergency Medicine | Admitting: Emergency Medicine

## 2012-07-21 DIAGNOSIS — M542 Cervicalgia: Secondary | ICD-10-CM | POA: Insufficient documentation

## 2012-07-21 DIAGNOSIS — Z87442 Personal history of urinary calculi: Secondary | ICD-10-CM | POA: Insufficient documentation

## 2012-07-21 DIAGNOSIS — Z79899 Other long term (current) drug therapy: Secondary | ICD-10-CM | POA: Insufficient documentation

## 2012-07-21 DIAGNOSIS — R591 Generalized enlarged lymph nodes: Secondary | ICD-10-CM

## 2012-07-21 DIAGNOSIS — R599 Enlarged lymph nodes, unspecified: Secondary | ICD-10-CM | POA: Insufficient documentation

## 2012-07-21 DIAGNOSIS — Z791 Long term (current) use of non-steroidal anti-inflammatories (NSAID): Secondary | ICD-10-CM | POA: Insufficient documentation

## 2012-07-21 LAB — CBC WITH DIFFERENTIAL/PLATELET
Basophils Absolute: 0.1 10*3/uL (ref 0.0–0.1)
Eosinophils Absolute: 1.7 10*3/uL — ABNORMAL HIGH (ref 0.0–0.7)
Lymphocytes Relative: 18 % (ref 12–46)
MCHC: 32.7 g/dL (ref 30.0–36.0)
Neutrophils Relative %: 48 % (ref 43–77)
Platelets: 214 10*3/uL (ref 150–400)
RDW: 13.7 % (ref 11.5–15.5)

## 2012-07-21 LAB — BASIC METABOLIC PANEL
Calcium: 10 mg/dL (ref 8.4–10.5)
GFR calc Af Amer: >90 mL/min (ref >90)
GFR calc non Af Amer: 78 mL/min — ABNORMAL LOW (ref >90)
Potassium: 4 mEq/L (ref 3.5–5.1)
Sodium: 140 mEq/L (ref 135–145)

## 2012-07-21 MED ORDER — DOXYCYCLINE HYCLATE 100 MG PO CAPS: 100.0000 mg | ORAL_CAPSULE | Freq: Two times a day (BID) | ORAL | Status: DC

## 2012-07-21 MED ORDER — NAPROXEN 500 MG PO TABS: 500.0000 mg | ORAL_TABLET | Freq: Two times a day (BID) | ORAL | Status: DC

## 2012-07-21 MED ORDER — IBUPROFEN 800 MG PO TABS
800.0000 mg | ORAL_TABLET | Freq: Once | ORAL | Status: AC
  Administered 2012-07-21: 800 mg via ORAL
  Filled 2012-07-21: qty 1

## 2012-07-21 NOTE — ED Notes (Signed)
Pt presents with swollen bilateral lymph nodes x 5-6 weeks. Pt states lymph nodes swell from time to time., has recently completed a full 2 week course of antibiotics without success. Pt denies fever, night sweats, decreased appetite and cough. NAD noted.

## 2012-07-21 NOTE — ED Provider Notes (Signed)
History   This chart was scribed for Shelda Jakes, MD by Gerlean Ren, ED Scribe. This patient was seen in room APFT23/APFT23 and the patient's care was started at 3:53 PM    CSN: [id number]  Arrival date & time 07/21/12  1517   First MD Initiated Contact with Patient 07/21/12 1550      Chief Complaint  Patient presents with  . Adenopathy    The history is provided by the patient. No language interpreter was used.   [name] Koch is a 31 y.o. female who presents to the Emergency Department complaining of a swollen mass on left side neck first noticed 1.5 months ago with associated waxing-and-waning soreness that is worsened by neck movement.  Pt denies fever, sore throat, rhinorrhea, otalgia, sinus pressure, HA, chest pain, cough, dyspnea, abdominal pain, nausea, emesis, appetite change, night sweats, rash, swelling in legs, back pain, or dysuria as associated.  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  Pt has no h/o chronic medical conditions.  Pt denies tobacco and alcohol use.   Past Medical History  Diagnosis Date  . Kidney stone   . Kidney stone     Past Surgical History  Procedure Date  . Tubal ligation   . Tubal ligation   . Breast surgery     History reviewed. No pertinent family history.  History  Substance Use Topics  . Smoking status: Never Smoker   . Smokeless tobacco: Not on file  . Alcohol Use: No    No OB history provided.  Review of Systems  Constitutional: Negative for fever and appetite change.  HENT: Positive for neck pain (Neck soreness). Negative for ear pain, rhinorrhea and sinus pressure.   Respiratory: Negative for shortness of breath.   Cardiovascular: Negative for chest pain and leg swelling.  Gastrointestinal: Negative for nausea, vomiting and abdominal pain.  Genitourinary: Negative for dysuria.  Musculoskeletal: Negative for back pain.  Skin: Negative for rash.  Neurological:  Negative for numbness and headaches.  Hematological: Positive for adenopathy.  Psychiatric/Behavioral: Negative for confusion.    Allergies  Review of patient's allergies indicates no known allergies.  Home Medications   Current Outpatient Rx  Name  Route  Sig  Dispense  Refill  . CITALOPRAM HYDROBROMIDE 40 MG PO TABS   Oral   Take 40 mg by mouth daily.         Marland Kitchen DIPHENHYDRAMINE-APAP (SLEEP) 25-500 MG PO TABS   Oral   Take 1 tablet by mouth at bedtime.          . IBUPROFEN 200 MG PO TABS   Oral   Take 800 mg by mouth every 8 (eight) hours as needed. pain         . DOXYCYCLINE HYCLATE 100 MG PO CAPS   Oral   Take 1 capsule (100 mg total) by mouth 2 (two) times daily.   28 capsule   0   . NAPROXEN 500 MG PO TABS   Oral   Take 1 tablet (500 mg total) by mouth 2 (two) times daily.   14 tablet   0   . SULFAMETHOXAZOLE-TRIMETHOPRIM 800-160 MG PO TABS   Oral   Take 1 tablet by mouth every 12 (twelve) hours.   14 tablet   0     BP 114/67  Pulse 84  Temp 97.6 F (36.4 C) (Oral)  Resp 18  Ht 5\' 3"  (1.6 m)  Wt 151  lb (68.493 kg)  BMI 26.75 kg/m2  SpO2 100%  LMP 07/14/2012  Physical Exam  Nursing note and vitals reviewed. Constitutional: She is oriented to person, place, and time. She appears well-developed and well-nourished.  HENT:  Head: Normocephalic and atraumatic.       Bilateral TM normal.  Eyes: Conjunctivae normal and EOM are normal.  Neck: No tracheal deviation present.       1-1.5 cm mass posterior to left sternocleidomastoid muscle tender to touch. No supraclavicular lymphadenopathy.  Cardiovascular: Normal rate, regular rhythm and normal heart sounds.   No murmur heard. Pulmonary/Chest: Effort normal and breath sounds normal. She has no wheezes.  Abdominal: Bowel sounds are normal.  Musculoskeletal: Normal range of motion. She exhibits no edema.  Neurological: She is alert and oriented to person, place, and time.  Skin: Skin is warm.    Psychiatric: She has a normal mood and affect.    ED Course  Procedures (including critical care time) DIAGNOSTIC STUDIES: Oxygen Saturation is 100% on room air, normal by my interpretation.    COORDINATION OF CARE: 4:01 PM- Patient informed of clinical course, understands medical decision-making process, and agrees with plan.  Ordered CBC, b-met, and chest XR.  Labs Reviewed  CBC WITH DIFFERENTIAL - Abnormal; Notable for the following:    Eosinophils Relative 27 (*)     Eosinophils Absolute 1.7 (*)     All other components within normal limits  BASIC METABOLIC PANEL - Abnormal; Notable for the following:    GFR calc non Af Amer 78 (*)     All other components within normal limits   Results for orders placed during the hospital encounter of 07/21/12  CBC WITH DIFFERENTIAL      Component Value Range   WBC 6.2  4.0 - 10.5 K/uL   RBC 4.53  3.87 - 5.11 MIL/uL   Hemoglobin 13.0  12.0 - 15.0 g/dL   HCT 40.9  81.1 - 91.4 %   MCV 87.9  78.0 - 100.0 fL   MCH 28.7  26.0 - 34.0 pg   MCHC 32.7  30.0 - 36.0 g/dL   RDW 78.2  95.6 - 21.3 %   Platelets 214  150 - 400 K/uL   Neutrophils Relative 48  43 - 77 %   Lymphocytes Relative 18  12 - 46 %   Monocytes Relative 6  3 - 12 %   Eosinophils Relative 27 (*) 0 - 5 %   Basophils Relative 1  0 - 1 %   Neutro Abs 2.9  1.7 - 7.7 K/uL   Lymphs Abs 1.1  0.7 - 4.0 K/uL   Monocytes Absolute 0.4  0.1 - 1.0 K/uL   Eosinophils Absolute 1.7 (*) 0.0 - 0.7 K/uL   Basophils Absolute 0.1  0.0 - 0.1 K/uL   WBC Morphology WHITE COUNT CONFIRMED ON SMEAR    BASIC METABOLIC PANEL      Component Value Range   Sodium 140  135 - 145 mEq/L   Potassium 4.0  3.5 - 5.1 mEq/L   Chloride 103  96 - 112 mEq/L   CO2 29  19 - 32 mEq/L   Glucose, Bld 98  70 - 99 mg/dL   BUN 8  6 - 23 mg/dL   Creatinine, Ser 0.86  0.50 - 1.10 mg/dL   Calcium 57.8  8.4 - 46.9 mg/dL   GFR calc non Af Amer 78 (*) >90 mL/min   GFR calc Af Amer >90  >90 mL/min  Dg Chest 2  View  07/21/2012  *RADIOLOGY REPORT*  Clinical Data: Adenopathy  CHEST - 2 VIEW  Comparison: 10/15/2011  Findings: Lungs are clear. No pleural effusion or pneumothorax.  Cardiomediastinal silhouette is within normal limits.  Visualized osseous structures are within normal limits.  IMPRESSION: Normal chest radiographs.   Original Report Authenticated By: Charline Bills, M.D.      1. Adenopathy       MDM  Left posterior single enlarged lymph node with some mild tenderness. Chest x-ray negative for any other adenopathy in the chest. CBC with differential without any significant abnormalities. Mason for colic panel was also normal. Will treat patient with doxycycline for 2 weeks referral to ear nose and throat for followup if it persisted may need to be biopsied. Patient understands the possibility of lymphoma. She also understands it does not get better it's important to have it biopsied.     I personally performed the services described in this documentation, which was scribed in my presence. The recorded information has been reviewed and is accurate.          Shelda Jakes, MD 07/21/12 [phone]

## 2012-07-21 NOTE — ED Notes (Signed)
Swollen lymph node to lt post neck for 1 month

## 2013-02-16 IMAGING — CT CT ABD-PELV W/O CM
2 of 3 series · 9 of 46 positions shown, 11 images · non-contrast
Comparison: None

CLINICAL DATA: Bilateral flank pain left greater than right,
history kidney stones, tubal ligation

CT ABDOMEN AND PELVIS WITHOUT CONTRAST
TECHNIQUE: Multidetector CT imaging of the abdomen and pelvis was
performed following the standard protocol without intravenous
contrast. Sagittal and coronal MPR images reconstructed from axial
data set.

[Series 4: mpr coronal (id) · coronal · 0.68mm/px · 8 of 71 slices shown, 9 images]
[im 8/71  soft-tissue]
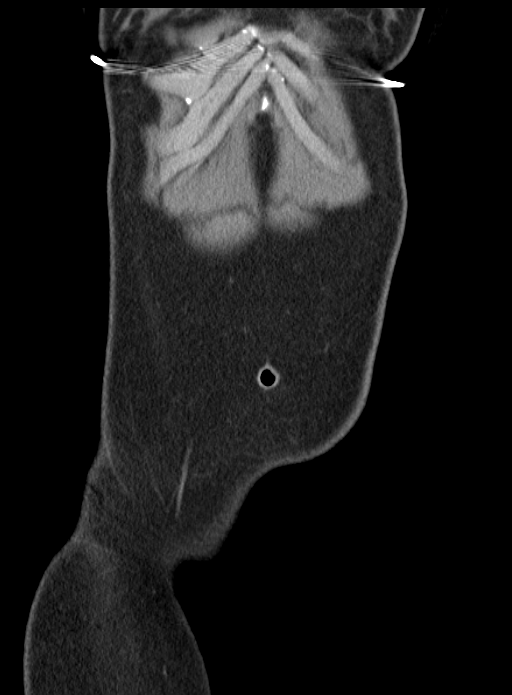
[im 8/71  bone]
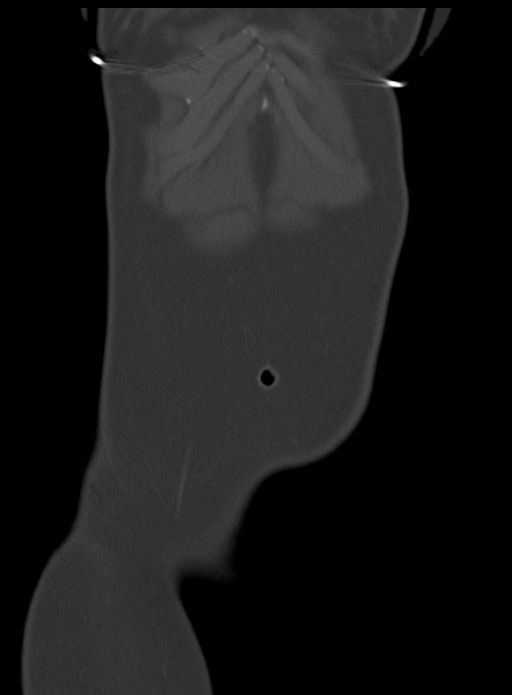
[im 16/71  soft-tissue]
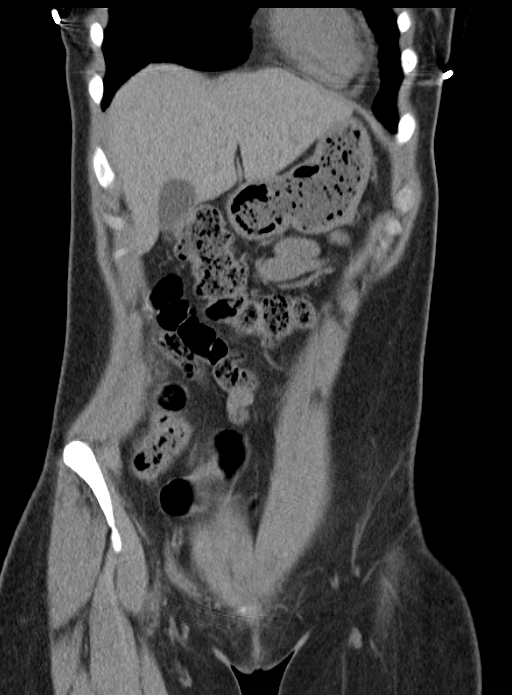
[im 24/71  soft-tissue]
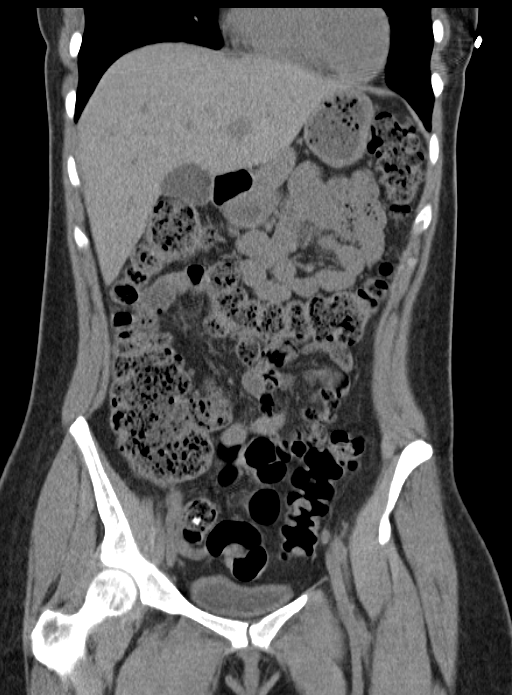
[im 32/71  soft-tissue]
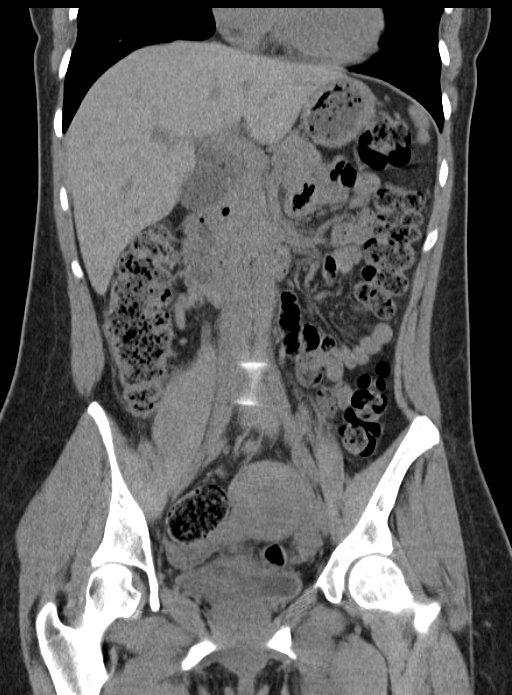
[im 39/71  soft-tissue]
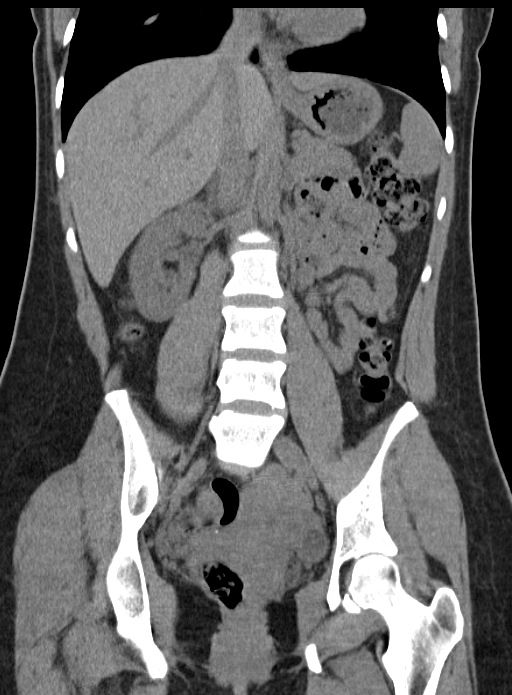
[im 47/71  soft-tissue]
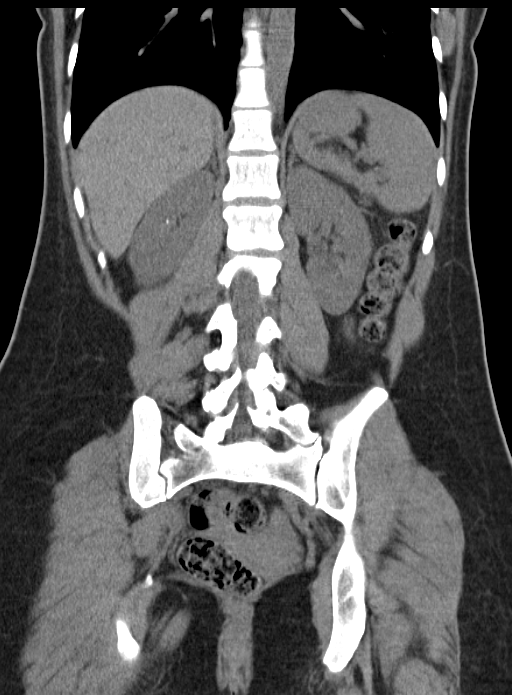
[im 55/71  soft-tissue]
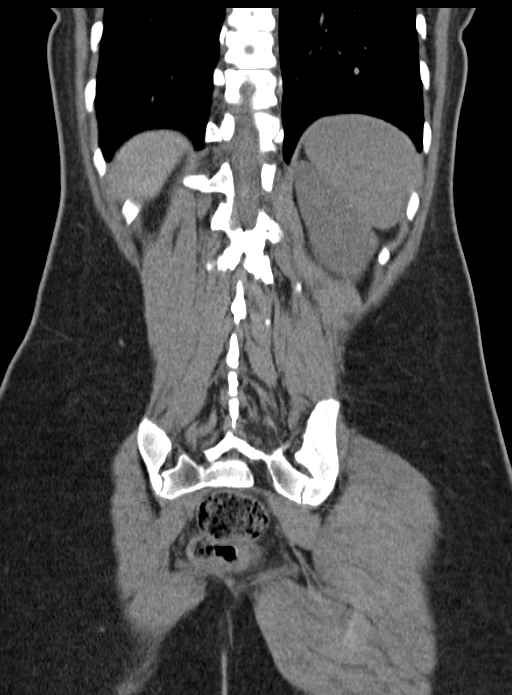
[im 63/71  soft-tissue]
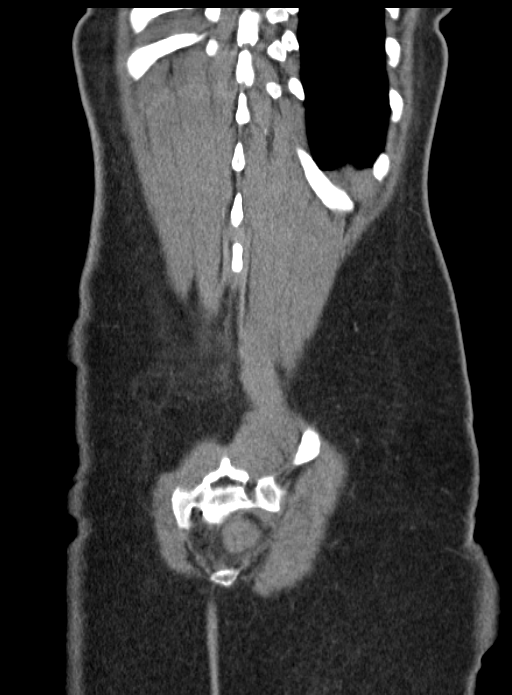

[Series 5: mpr sagittal (id) · sagittal · 0.51mm/px · 1 of 105 slices shown, 2 images]
[im 35/105  soft-tissue]
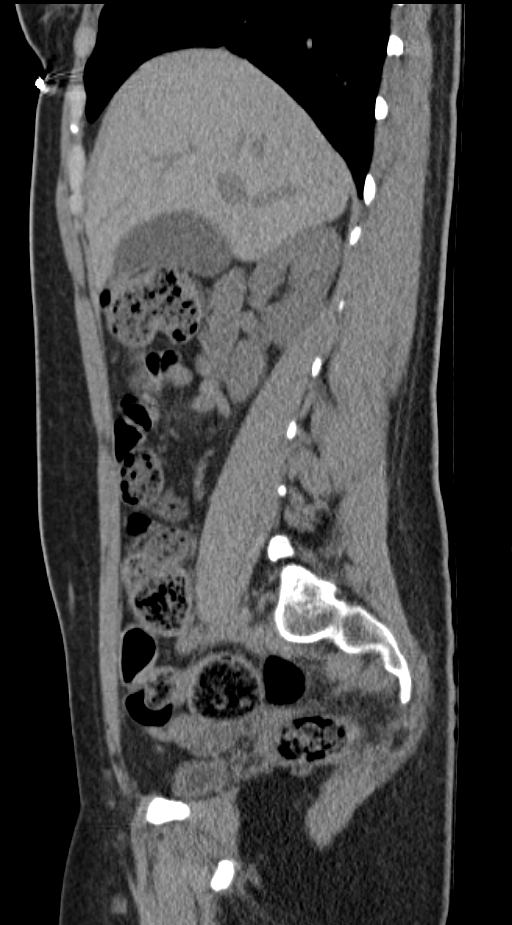
[im 35/105  bone]
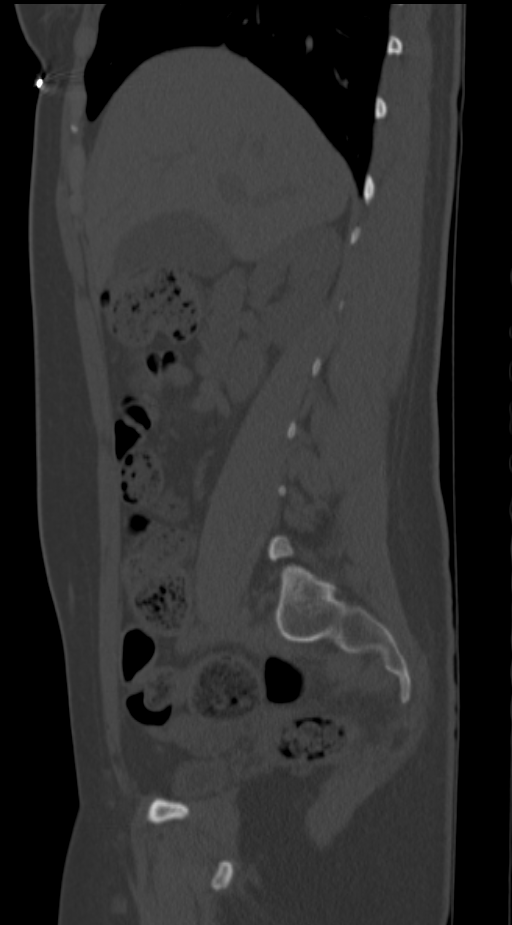

[9 of 46 positions shown; findings below may reference images not displayed]

FINDINGS: Lung bases clear.
Tiny nonobstructing calculus upper pole right kidney.
Large 8 x 6 x 10 mm calculus lower pole left kidney.
No hydronephrosis, ureteral dilatation or ureteral calcification.
Bladder decompressed, unremarkable.
Solid organs otherwise unremarkable for exam lacking IV and oral
contrast.

Normal appendix.
Tiny cyst left ovary 2.0 x 1.7 cm image 74.
Stomach and bowel loops grossly normal appearance for technique,
with stool scattered throughout colon.
No mass, adenopathy, free fluid or inflammatory process.
Osseous structures unremarkable.
IMPRESSION: Bilateral nonobstructing renal calculi.
Tiny left ovarian cyst.
Otherwise normal exam.

## 2013-05-24 ENCOUNTER — Emergency Department (HOSPITAL_COMMUNITY)
Admission: EM | Admit: 2013-05-24 | Discharge: 2013-05-24 | Disposition: A | Discharge status: Home / Self Care | Payer: Self-pay | Attending: Emergency Medicine | Admitting: Emergency Medicine

## 2013-05-24 ENCOUNTER — Encounter (HOSPITAL_COMMUNITY): Payer: Self-pay

## 2013-05-24 DIAGNOSIS — K029 Dental caries, unspecified: Secondary | ICD-10-CM | POA: Insufficient documentation

## 2013-05-24 DIAGNOSIS — Z791 Long term (current) use of non-steroidal anti-inflammatories (NSAID): Secondary | ICD-10-CM | POA: Insufficient documentation

## 2013-05-24 DIAGNOSIS — Z792 Long term (current) use of antibiotics: Secondary | ICD-10-CM | POA: Insufficient documentation

## 2013-05-24 DIAGNOSIS — Z79899 Other long term (current) drug therapy: Secondary | ICD-10-CM | POA: Insufficient documentation

## 2013-05-24 DIAGNOSIS — Z87442 Personal history of urinary calculi: Secondary | ICD-10-CM | POA: Insufficient documentation

## 2013-05-24 MED ORDER — HYDROCODONE-ACETAMINOPHEN 5-325 MG PO TABS
1.0000 | ORAL_TABLET | Freq: Once | ORAL | Status: AC
  Administered 2013-05-24: 1 via ORAL
  Filled 2013-05-24: qty 1

## 2013-05-24 MED ORDER — HYDROCODONE-ACETAMINOPHEN 5-325 MG PO TABS: 1.0000 | ORAL_TABLET | ORAL | Status: DC | PRN

## 2013-05-24 MED ORDER — AMOXICILLIN 500 MG PO CAPS: 500.0000 mg | ORAL_CAPSULE | Freq: Three times a day (TID) | ORAL | Status: AC

## 2013-05-24 MED ORDER — IBUPROFEN 600 MG PO TABS: 600.0000 mg | ORAL_TABLET | Freq: Four times a day (QID) | ORAL | Status: DC | PRN

## 2013-05-24 MED ORDER — AMOXICILLIN 250 MG PO CAPS
500.0000 mg | ORAL_CAPSULE | Freq: Once | ORAL | Status: AC
  Administered 2013-05-24: 500 mg via ORAL
  Filled 2013-05-24: qty 2

## 2013-05-24 NOTE — ED Provider Notes (Signed)
CSN: [id number]     Arrival date & time 05/24/13  1133 History   First MD Initiated Contact with Patient 05/24/13 1140     Chief Complaint  Patient presents with  . Dental Pain   (Consider location/radiation/quality/duration/timing/severity/associated sxs/prior Treatment) HPI Comments: [name] Koch is a 32 y.o. 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  She denies pain however prior to this week.     There has been no fevers,  Chills, nausea or vomiting, also no complaint of difficulty swallowing,  Although chewing makes pain worse.  The patient has tried ibuprofen without relief of symptoms.         The history is provided by the patient.    Past Medical History  Diagnosis Date  . Kidney stone   . Kidney stone    Past Surgical History  Procedure Laterality Date  . Tubal ligation    . Tubal ligation    . Breast surgery     No family history on file. History  Substance Use Topics  . Smoking status: Never Smoker   . Smokeless tobacco: Not on file  . Alcohol Use: No   OB History   Grav Para Term Preterm Abortions TAB SAB Ect Mult Living                 Review of Systems  Constitutional: Negative for fever.  HENT: Positive for dental problem. Negative for sore throat, facial swelling, neck pain and neck stiffness.   Respiratory: Negative for shortness of breath.     Allergies  Review of patient's allergies indicates no known allergies.  Home Medications   Current Outpatient Rx  Name  Route  Sig  Dispense  Refill  . amoxicillin (AMOXIL) 500 MG capsule   Oral   Take 1 capsule (500 mg total) by mouth 3 (three) times daily.   30 capsule   0   . citalopram (CELEXA) 40 MG tablet   Oral   Take 40 mg by mouth daily.         . diphenhydramine-acetaminophen (TYLENOL PM) 25-500 MG TABS   Oral   Take 1  tablet by mouth at bedtime.          Marland Kitchen doxycycline (VIBRAMYCIN) 100 MG capsule   Oral   Take 1 capsule (100 mg total) by mouth 2 (two) times daily.   28 capsule   0   . HYDROcodone-acetaminophen (NORCO/VICODIN) 5-325 MG per tablet   Oral   Take 1 tablet by mouth every 4 (four) hours as needed for pain.   15 tablet   0   . ibuprofen (ADVIL,MOTRIN) 600 MG tablet   Oral   Take 1 tablet (600 mg total) by mouth every 6 (six) hours as needed for pain.   20 tablet   0   . naproxen (NAPROSYN) 500 MG tablet   Oral   Take 1 tablet (500 mg total) by mouth 2 (two) times daily.   14 tablet   0   . sulfamethoxazole-trimethoprim (SEPTRA DS) 800-160 MG per tablet   Oral   Take 1 tablet by mouth every 12 (twelve) hours.   14 tablet   0    BP 112/70  Pulse 64  Temp(Src) 98 F (36.7 C) (Oral)  Resp 17  Ht 5\' 4"  (1.626 m)  Wt 151  lb (68.493 kg)  BMI 25.91 kg/m2  SpO2 100%  LMP 05/23/2013 Physical Exam  Constitutional: She is oriented to person, place, and time. She appears well-developed and well-nourished. No distress.  HENT:  Head: Normocephalic and atraumatic.  Right Ear: Tympanic membrane and external ear normal.  Left Ear: Tympanic membrane and external ear normal.  Mouth/Throat: Oropharynx is clear and moist and mucous membranes are normal. No oral lesions. No trismus in the jaw. Dental caries present.  Multiple dental caries,  Predominantly on left side,  Upper dentition with decay at rootline,  Lower left 1st and 2nd molars decayed to gingiva.  No visible edema or fluctuance suggesting dental abscess.  She does have mild gingival erythema consistent with gingivitis.  Neck: Normal range of motion. Neck supple.  Cardiovascular: Normal rate.   Pulmonary/Chest: Effort normal.  Abdominal: She exhibits no distension.  Lymphadenopathy:    She has no cervical adenopathy.  Neurological: She is alert and oriented to person, place, and time.  Skin: Skin is warm and dry. No  erythema.  Psychiatric: She has a normal mood and affect.    ED Course  Procedures (including critical care time) Labs Review Labs Reviewed - No data to display Imaging Review No results found.  MDM   1. Dental caries    Will cover with antibiotics as well given new onset of pain, suspect early dental infection.  She was prescribed amoxicillin, hydrocodone, ibuprofen.  She was given dental referrals including the free dental clinic taken place this weekend in [place].  The patient appears reasonably screened and/or stabilized for discharge and I doubt any other medical condition or other [facility] requiring further screening, evaluation, or treatment in the ED at this time prior to discharge.     Burgess Amor, PA-C 05/24/13 1227

## 2013-05-24 NOTE — ED Notes (Signed)
Pt has mult dental caries.  Pain lt side of mouth . Since last night.

## 2013-05-25 NOTE — ED Provider Notes (Signed)
Medical screening examination/treatment/procedure(s) were performed by non-physician practitioner and as supervising physician I was immediately available for consultation/collaboration.  Jimmye Wisnieski, MD 05/25/13 1536 

## 2013-07-06 ENCOUNTER — Emergency Department (HOSPITAL_COMMUNITY): Payer: Self-pay

## 2013-07-06 ENCOUNTER — Encounter (HOSPITAL_COMMUNITY): Payer: Self-pay | Admitting: Emergency Medicine

## 2013-07-06 ENCOUNTER — Emergency Department (HOSPITAL_COMMUNITY)
Admission: EM | Admit: 2013-07-06 | Discharge: 2013-07-06 | Disposition: A | Discharge status: Home / Self Care | Payer: Self-pay | Attending: Emergency Medicine | Admitting: Emergency Medicine

## 2013-07-06 DIAGNOSIS — Z79899 Other long term (current) drug therapy: Secondary | ICD-10-CM | POA: Insufficient documentation

## 2013-07-06 DIAGNOSIS — Z791 Long term (current) use of non-steroidal anti-inflammatories (NSAID): Secondary | ICD-10-CM | POA: Insufficient documentation

## 2013-07-06 DIAGNOSIS — Z3202 Encounter for pregnancy test, result negative: Secondary | ICD-10-CM | POA: Insufficient documentation

## 2013-07-06 DIAGNOSIS — N200 Calculus of kidney: Secondary | ICD-10-CM | POA: Insufficient documentation

## 2013-07-06 DIAGNOSIS — Z792 Long term (current) use of antibiotics: Secondary | ICD-10-CM | POA: Insufficient documentation

## 2013-07-06 LAB — CBC WITH DIFFERENTIAL/PLATELET
Basophils Relative: 1 % (ref 0–1)
Eosinophils Absolute: 0 10*3/uL (ref 0.0–0.7)
Hemoglobin: 14 g/dL (ref 12.0–15.0)
Lymphs Abs: 0.9 10*3/uL (ref 0.7–4.0)
MCHC: 34.2 g/dL (ref 30.0–36.0)
Monocytes Relative: 6 % (ref 3–12)
Neutro Abs: 5 10*3/uL (ref 1.7–7.7)
Neutrophils Relative %: 79 % — ABNORMAL HIGH (ref 43–77)
Platelets: 164 10*3/uL (ref 150–400)
RBC: 4.62 MIL/uL (ref 3.87–5.11)

## 2013-07-06 LAB — URINALYSIS, ROUTINE W REFLEX MICROSCOPIC
Nitrite: NEGATIVE
Protein, ur: 100 mg/dL — AB
Specific Gravity, Urine: 1.028 (ref 1.005–1.030)
Urobilinogen, UA: 0.2 mg/dL (ref 0.0–1.0)

## 2013-07-06 LAB — POCT I-STAT, CHEM 8
BUN: 11 mg/dL (ref 6–23)
Calcium, Ion: 1.4 mmol/L — ABNORMAL HIGH (ref 1.12–1.23)
HCT: 44 % (ref 36.0–46.0)
Hemoglobin: 15 g/dL (ref 12.0–15.0)
Sodium: 140 mEq/L (ref 135–145)
TCO2: 25 mmol/L (ref 0–100)

## 2013-07-06 LAB — URINE MICROSCOPIC-ADD ON

## 2013-07-06 LAB — POCT PREGNANCY, URINE: Preg Test, Ur: NEGATIVE

## 2013-07-06 MED ORDER — HYDROMORPHONE HCL PF 1 MG/ML IJ SOLN
1.0000 mg | Freq: Once | INTRAMUSCULAR | Status: AC
  Administered 2013-07-06: 1 mg via INTRAVENOUS
  Filled 2013-07-06: qty 1

## 2013-07-06 MED ORDER — SODIUM CHLORIDE 0.9 % IV BOLUS (SEPSIS)
500.0000 mL | Freq: Once | INTRAVENOUS | Status: AC
  Administered 2013-07-06: 500 mL via INTRAVENOUS

## 2013-07-06 MED ORDER — HYDROMORPHONE HCL PF 1 MG/ML IJ SOLN
0.5000 mg | Freq: Once | INTRAMUSCULAR | Status: AC
  Administered 2013-07-06: 0.5 mg via INTRAVENOUS
  Filled 2013-07-06: qty 1

## 2013-07-06 MED ORDER — KETOROLAC TROMETHAMINE 30 MG/ML IJ SOLN
30.0000 mg | Freq: Once | INTRAMUSCULAR | Status: AC
  Administered 2013-07-06: 30 mg via INTRAVENOUS
  Filled 2013-07-06: qty 1

## 2013-07-06 MED ORDER — SODIUM CHLORIDE 0.9 % IV BOLUS (SEPSIS)
1000.0000 mL | INTRAVENOUS | Status: AC
  Administered 2013-07-06: 1000 mL via INTRAVENOUS

## 2013-07-06 MED ORDER — KETOROLAC TROMETHAMINE 30 MG/ML IJ SOLN
INTRAMUSCULAR | Status: AC
  Filled 2013-07-06: qty 1

## 2013-07-06 MED ORDER — TAMSULOSIN HCL 0.4 MG PO CAPS: 0.4000 mg | ORAL_CAPSULE | Freq: Every day | ORAL | Status: DC

## 2013-07-06 MED ORDER — OXYCODONE-ACETAMINOPHEN 5-325 MG PO TABS: 1.0000 | ORAL_TABLET | ORAL | Status: DC | PRN

## 2013-07-06 MED ORDER — ONDANSETRON HCL 4 MG/2ML IJ SOLN
4.0000 mg | Freq: Once | INTRAMUSCULAR | Status: AC
  Administered 2013-07-06: 4 mg via INTRAVENOUS
  Filled 2013-07-06: qty 2

## 2013-07-06 NOTE — ED Provider Notes (Signed)
CSN: [id number]     Arrival date & time 07/06/13  0451 History   First MD Initiated Contact with Patient 07/06/13 0510     Chief Complaint  Patient presents with  . Flank Pain   (Consider location/radiation/quality/duration/timing/severity/associated sxs/prior Treatment) Patient is a 32 y.o. female presenting with flank pain. The history is provided by the patient.  Flank Pain Associated symptoms include abdominal pain. Pertinent negatives include no chest pain, no headaches and no shortness of breath.   patient has had some pain in her left abdomen the last few days. It was worse for last couple hours. She said that the pain is severe. No history of ovarian cyst. She's history kidney stones but does not run but it feels like. She states his pain is severe as the stones were. She's had some nausea, and vomiting but thinks it is related to the pain. No diarrhea or constipation.  Past Medical History  Diagnosis Date  . Kidney stone   . Kidney stone    Past Surgical History  Procedure Laterality Date  . Tubal ligation    . Tubal ligation    . Breast surgery     History reviewed. No pertinent family history. History  Substance Use Topics  . Smoking status: Never Smoker   . Smokeless tobacco: Not on file  . Alcohol Use: No   OB History   Grav Para Term Preterm Abortions TAB SAB Ect Mult Living                 Review of Systems  Constitutional: Negative for activity change and appetite change.  Eyes: Negative for pain.  Respiratory: Negative for chest tightness and shortness of breath.   Cardiovascular: Negative for chest pain and leg swelling.  Gastrointestinal: Positive for abdominal pain. Negative for nausea, vomiting and diarrhea.  Genitourinary: Positive for flank pain. Negative for vaginal discharge and vaginal pain.  Musculoskeletal: Negative for back pain and neck stiffness.  Skin: Negative for rash.  Neurological: Negative for weakness, numbness and headaches.   Psychiatric/Behavioral: Negative for behavioral problems.    Allergies  Review of patient's allergies indicates no known allergies.  Home Medications   Current Outpatient Rx  Name  Route  Sig  Dispense  Refill  . citalopram (CELEXA) 40 MG tablet   Oral   Take 40 mg by mouth daily.         . diphenhydramine-acetaminophen (TYLENOL PM) 25-500 MG TABS   Oral   Take 1 tablet by mouth at bedtime.          Marland Kitchen doxycycline (VIBRAMYCIN) 100 MG capsule   Oral   Take 1 capsule (100 mg total) by mouth 2 (two) times daily.   28 capsule   0   . HYDROcodone-acetaminophen (NORCO/VICODIN) 5-325 MG per tablet   Oral   Take 1 tablet by mouth every 4 (four) hours as needed for pain.   15 tablet   0   . ibuprofen (ADVIL,MOTRIN) 600 MG tablet   Oral   Take 1 tablet (600 mg total) by mouth every 6 (six) hours as needed for pain.   20 tablet   0   . naproxen (NAPROSYN) 500 MG tablet   Oral   Take 1 tablet (500 mg total) by mouth 2 (two) times daily.   14 tablet   0   . sulfamethoxazole-trimethoprim (SEPTRA DS) 800-160 MG per tablet   Oral   Take 1 tablet by mouth every 12 (twelve) hours.   14 tablet  0    BP 126/53  Pulse 96  Temp(Src) 97.9 F (36.6 C) (Oral)  SpO2 100%  LMP 06/25/2013 Physical Exam  Nursing note and vitals reviewed. Constitutional: She is oriented to person, place, and time. She appears well-developed and well-nourished.  HENT:  Head: Normocephalic and atraumatic.  Eyes: EOM are normal. Pupils are equal, round, and reactive to light.  Neck: Normal range of motion. Neck supple.  Cardiovascular: Normal rate, regular rhythm and normal heart sounds.   No murmur heard. Pulmonary/Chest: Effort normal and breath sounds normal. No respiratory distress. She has no wheezes. She has no rales.  Abdominal: Soft. Bowel sounds are normal. She exhibits no distension. There is tenderness. There is no rebound and no guarding.  Left lower abdominal tenderness. No rebound  or guarding.  Genitourinary:  No CVA tenderness  Musculoskeletal: Normal range of motion.  Neurological: She is alert and oriented to person, place, and time. No cranial nerve deficit.  Skin: Skin is warm and dry.  Psychiatric: She has a normal mood and affect. Her speech is normal.    ED Course  Procedures (including critical care time) Labs Review Labs Reviewed  CBC WITH DIFFERENTIAL  URINALYSIS, ROUTINE W REFLEX MICROSCOPIC   Imaging Review No results found.  EKG Interpretation   None       MDM  No diagnosis found. Patient with left abdominal flank pain. Urine shows some ketones. Ultrasound does not show hydronephrosis. Lab work is pending at this time.    Juliet Rude. Rubin Payor, MD 07/06/13 [phone]

## 2013-07-06 NOTE — ED Notes (Signed)
Left flank pain since yesterday; "took ibuprofen and vomited it up." no sx. Of uti. Hx. Of kidney stones

## 2013-07-06 NOTE — ED Notes (Signed)
Pt returned from US

## 2013-07-06 NOTE — ED Provider Notes (Signed)
7:15 AM Accepted care from Dr. Rubin Payor. 64F w hx of kidney stones here w/ left flank pain.   9:57 AM: Pt feeling much better, would like to go. Will give URO referral, pain medicine for home. Labs/imaging non-contrib.  I have discussed the diagnosis/risks/treatment options with the patient and believe the pt to be eligible for discharge home to follow-up with URO as needed. We also discussed returning to the ED immediately if new or worsening sx occur. We discussed the sx which are most concerning (e.g., worsening pain, vomiting) that necessitate immediate return. Any new prescriptions provided to the patient are listed below.  Discharge Medication List as of 07/06/2013  9:59 AM    START taking these medications   Details  oxyCODONE-acetaminophen (PERCOCET) 5-325 MG per tablet Take 1 tablet by mouth every 4 (four) hours as needed., Starting 07/06/2013, Until Discontinued, Print    tamsulosin (FLOMAX) 0.4 MG CAPS capsule Take 1 capsule (0.4 mg total) by mouth daily., Starting 07/06/2013, Until Discontinued, Print       Clinical Impression 1. Nephrolithiasis      Junius Argyle, MD 07/07/13 [phone]

## 2013-07-07 LAB — URINE CULTURE: Colony Count: 30000

## 2013-09-05 ENCOUNTER — Emergency Department (HOSPITAL_COMMUNITY): Payer: Self-pay

## 2013-09-05 ENCOUNTER — Encounter (HOSPITAL_COMMUNITY): Payer: Self-pay | Admitting: Emergency Medicine

## 2013-09-05 ENCOUNTER — Observation Stay (HOSPITAL_COMMUNITY)
Admission: EM | Admit: 2013-09-05 | Discharge: 2013-09-06 | Disposition: A | Discharge status: Home / Self Care | Payer: Self-pay | Attending: Internal Medicine | Admitting: Internal Medicine

## 2013-09-05 DIAGNOSIS — N200 Calculus of kidney: Principal | ICD-10-CM | POA: Diagnosis present

## 2013-09-05 DIAGNOSIS — N1330 Unspecified hydronephrosis: Secondary | ICD-10-CM | POA: Diagnosis present

## 2013-09-05 DIAGNOSIS — N23 Unspecified renal colic: Secondary | ICD-10-CM | POA: Diagnosis present

## 2013-09-05 DIAGNOSIS — Z01812 Encounter for preprocedural laboratory examination: Secondary | ICD-10-CM | POA: Insufficient documentation

## 2013-09-05 LAB — URINALYSIS, ROUTINE W REFLEX MICROSCOPIC
BILIRUBIN URINE: NEGATIVE
GLUCOSE, UA: NEGATIVE mg/dL
Ketones, ur: NEGATIVE mg/dL
Leukocytes, UA: NEGATIVE
Nitrite: NEGATIVE
Protein, ur: NEGATIVE mg/dL
UROBILINOGEN UA: 0.2 mg/dL (ref 0.0–1.0)
pH: 6 (ref 5.0–8.0)

## 2013-09-05 LAB — CBC WITH DIFFERENTIAL/PLATELET
BASOS PCT: 1 % (ref 0–1)
Basophils Absolute: 0 10*3/uL (ref 0.0–0.1)
EOS ABS: 0.1 10*3/uL (ref 0.0–0.7)
Eosinophils Relative: 1 % (ref 0–5)
HEMATOCRIT: 42.1 % (ref 36.0–46.0)
HEMOGLOBIN: 13.7 g/dL (ref 12.0–15.0)
Lymphocytes Relative: 25 % (ref 12–46)
Lymphs Abs: 1.3 10*3/uL (ref 0.7–4.0)
MCH: 29.3 pg (ref 26.0–34.0)
MCHC: 32.5 g/dL (ref 30.0–36.0)
MCV: 90 fL (ref 78.0–100.0)
MONO ABS: 0.4 10*3/uL (ref 0.1–1.0)
MONOS PCT: 7 % (ref 3–12)
NEUTROS ABS: 3.4 10*3/uL (ref 1.7–7.7)
Neutrophils Relative %: 66 % (ref 43–77)
Platelets: 180 10*3/uL (ref 150–400)
RBC: 4.68 MIL/uL (ref 3.87–5.11)
RDW: 13 % (ref 11.5–15.5)
WBC: 5.1 10*3/uL (ref 4.0–10.5)

## 2013-09-05 LAB — BASIC METABOLIC PANEL
BUN: 13 mg/dL (ref 6–23)
CHLORIDE: 103 meq/L (ref 96–112)
CO2: 25 mEq/L (ref 19–32)
CREATININE: 0.82 mg/dL (ref 0.50–1.10)
Calcium: 10.2 mg/dL (ref 8.4–10.5)
GFR calc non Af Amer: >90 mL/min (ref >90)
GLUCOSE: 102 mg/dL — AB (ref 70–99)
Potassium: 3.9 mEq/L (ref 3.7–5.3)
Sodium: 140 mEq/L (ref 137–147)

## 2013-09-05 LAB — PREGNANCY, URINE: Preg Test, Ur: NEGATIVE

## 2013-09-05 LAB — URINE MICROSCOPIC-ADD ON

## 2013-09-05 MED ORDER — POTASSIUM CHLORIDE IN NACL 20-0.9 MEQ/L-% IV SOLN
INTRAVENOUS | Status: DC
Start: 2013-09-05 — End: 2013-09-06
  Administered 2013-09-05 – 2013-09-06 (×2): via INTRAVENOUS

## 2013-09-05 MED ORDER — HYDROMORPHONE HCL PF 1 MG/ML IJ SOLN
1.0000 mg | Freq: Once | INTRAMUSCULAR | Status: AC
  Administered 2013-09-05: 1 mg via INTRAVENOUS
  Filled 2013-09-05: qty 1

## 2013-09-05 MED ORDER — HYDROMORPHONE HCL PF 1 MG/ML IJ SOLN
1.0000 mg | INTRAMUSCULAR | Status: DC | PRN
  Administered 2013-09-05: 1 mg via INTRAVENOUS
  Filled 2013-09-05: qty 1

## 2013-09-05 MED ORDER — KETOROLAC TROMETHAMINE 30 MG/ML IJ SOLN
30.0000 mg | Freq: Four times a day (QID) | INTRAMUSCULAR | Status: DC
  Administered 2013-09-05 – 2013-09-06 (×4): 30 mg via INTRAVENOUS
  Filled 2013-09-05 (×4): qty 1

## 2013-09-05 MED ORDER — SODIUM CHLORIDE 0.9 % IV SOLN
INTRAVENOUS | Status: AC
Start: 2013-09-05 — End: 2013-09-06
  Administered 2013-09-05: 16:00:00 via INTRAVENOUS

## 2013-09-05 MED ORDER — ONDANSETRON HCL 4 MG/2ML IJ SOLN
4.0000 mg | Freq: Once | INTRAMUSCULAR | Status: AC
  Administered 2013-09-05: 4 mg via INTRAVENOUS
  Filled 2013-09-05: qty 2

## 2013-09-05 MED ORDER — ACETAMINOPHEN 325 MG PO TABS: 650.0000 mg | ORAL_TABLET | Freq: Four times a day (QID) | ORAL | Status: DC | PRN

## 2013-09-05 MED ORDER — MORPHINE SULFATE 2 MG/ML IJ SOLN
2.0000 mg | INTRAMUSCULAR | Status: DC | PRN
  Administered 2013-09-05 – 2013-09-06 (×4): 2 mg via INTRAVENOUS
  Filled 2013-09-05 (×4): qty 1

## 2013-09-05 MED ORDER — ACETAMINOPHEN 650 MG RE SUPP: 650.0000 mg | Freq: Four times a day (QID) | RECTAL | Status: DC | PRN

## 2013-09-05 MED ORDER — HYDROMORPHONE HCL PF 2 MG/ML IJ SOLN
2.0000 mg | Freq: Once | INTRAMUSCULAR | Status: AC
  Administered 2013-09-05: 2 mg via INTRAVENOUS
  Filled 2013-09-05: qty 1

## 2013-09-05 MED ORDER — ONDANSETRON HCL 4 MG/2ML IJ SOLN: 4.0000 mg | Freq: Three times a day (TID) | INTRAMUSCULAR | Status: DC | PRN

## 2013-09-05 MED ORDER — ONDANSETRON HCL 4 MG PO TABS: 4.0000 mg | ORAL_TABLET | Freq: Four times a day (QID) | ORAL | Status: DC | PRN

## 2013-09-05 MED ORDER — CIPROFLOXACIN IN D5W 200 MG/100ML IV SOLN
200.0000 mg | Freq: Once | INTRAVENOUS | Status: AC
  Administered 2013-09-06: 200 mg via INTRAVENOUS
  Filled 2013-09-05: qty 100

## 2013-09-05 MED ORDER — ONDANSETRON HCL 4 MG/2ML IJ SOLN: 4.0000 mg | Freq: Four times a day (QID) | INTRAMUSCULAR | Status: DC | PRN

## 2013-09-05 NOTE — ED Notes (Signed)
Pt with sudden left lower quad abd pain about an hour ago, states vaginal bleeding started yesterday and is 2 weeks early due for next menstrual cycle, +nausea, denies vomiting or diarrhea, denies taking any pain meds at home for pain

## 2013-09-05 NOTE — Consult Note (Signed)
Note dictated [phone]#[id number]

## 2013-09-05 NOTE — H&P (Signed)
[facility] History and Physical  [name] Koch ZOX:[id number] DOB: 1980-10-06 DOA: 09/05/2013  Referring physician: Dr. Estell HarpinZammit, ER physician PCP: No PCP Per Patient   Chief Complaint: abdominal pain  HPI: [name] Koch is a 33 y.o. female with no significant past medical history except for history of nephrolithiasis in the past. Patient presents to the emergency room today with acute onset of left lower corner abdominal pain. She reports her symptoms began between 10 and 11:00 this morning. She had associated vomiting. No fever. No hematuria, diarrhea, shortness of breath, cough or any other complaints. Her pain has been fluctuating, but consistently present. She evaluated the emergency room where imaging indicated a left ureteropelvic junction stone with mild left-sided hydronephrosis. The patient has been admitted to the hospital for further treatment   Review of Systems: Pertinent positives as per HPI, otherwise negative  Past Medical History  Diagnosis Date  . Kidney stone   . Kidney stone    Past Surgical History  Procedure Laterality Date  . Tubal ligation    . Tubal ligation    . Breast surgery     Social History:  reports that she has never smoked. She does not have any smokeless tobacco history on file. She reports that she does not drink alcohol or use illicit drugs.  No Known Allergies  Family History: father's side of family has a history of blood clots.   Prior to Admission medications   Medication Sig Start Date End Date Taking? Authorizing Provider  Diphenhydramine-APAP, sleep, (ACETAMINOPHEN PM EX ST PO) Take 2 capsules by mouth at bedtime as needed (pain).   Yes Historical Provider, MD   Physical Exam: Filed Vitals:   09/05/13 1706  BP: 101/60  Pulse: 65  Temp: 98 F (36.7 C)  Resp: 20    BP 101/60  Pulse 65  Temp(Src) 98 F (36.7 C) (Oral)  Resp 20  Ht 5\' 5"  (1.651 m)  Wt 70.308 kg (155 lb)  BMI 25.79 kg/m2  SpO2 96%  LMP  09/04/2013  General:  Appears calm and comfortable Eyes: PERRL, normal lids, irises & conjunctiva ENT: grossly normal hearing, lips & tongue Neck: no LAD, masses or thyromegaly Cardiovascular: RRR, no m/r/g. No LE edema. Telemetry: SR, no arrhythmias  Respiratory: CTA bilaterally, no w/r/r. Normal respiratory effort. Abdomen: soft, ntnd Skin: no rash or induration seen on limited exam Musculoskeletal: grossly normal tone BUE/BLE Psychiatric: grossly normal mood and affect, speech fluent and appropriate Neurologic: grossly non-focal.          Labs on Admission:  Basic Metabolic Panel:  Recent Labs Lab 09/05/13 1203  NA 140  K 3.9  CL 103  CO2 25  GLUCOSE 102*  BUN 13  CREATININE 0.82  CALCIUM 10.2   Liver Function Tests: No results found for this basename: AST, ALT, ALKPHOS, BILITOT, PROT, ALBUMIN,  in the last 168 hours No results found for this basename: LIPASE, AMYLASE,  in the last 168 hours No results found for this basename: AMMONIA,  in the last 168 hours CBC:  Recent Labs Lab 09/05/13 1203  WBC 5.1  NEUTROABS 3.4  HGB 13.7  HCT 42.1  MCV 90.0  PLT 180   Cardiac Enzymes: No results found for this basename: CKTOTAL, CKMB, CKMBINDEX, TROPONINI,  in the last 168 hours  BNP (last 3 results) No results found for this basename: PROBNP,  in the last 8760 hours CBG: No results found for this basename: GLUCAP,  in the last 168 hours  Radiological Exams on Admission: Ct Abdomen Pelvis Wo Contrast  09/05/2013   CLINICAL DATA:  Left flank pain  EXAM: CT ABDOMEN AND PELVIS WITHOUT CONTRAST  TECHNIQUE: Multidetector CT imaging of the abdomen and pelvis was performed following the standard protocol without IV contrast.  COMPARISON:  05/23/2011  FINDINGS: 12 mm calculus at the left ureteropelvic junction is associated with mild left hydronephrosis. Tiny calculus in the lower pole of the left kidney. No right ureteral calculi.  Left adnexal cyst is not significantly  changed.  Liver, gallbladder, spleen, pancreas, adrenal glands are within normal limits.  Normal appendix.  Bladder, right adnexa, and uterus are within normal limits.  No destructive bone lesion.  L5 is partially sacralized.  IMPRESSION: Left ureteropelvic junction calculus is associated with mild left hydronephrosis.  Left nephrolithiasis.   Electronically Signed   By: Maryclare Bean M.D.   On: 09/05/2013 14:08   Dg Abd 1 View  09/05/2013   CLINICAL DATA:  Left flank pain.  History of urinary tract stones.  EXAM: ABDOMEN - 1 VIEW  COMPARISON:  CT abdomen and pelvis 09/05/2013.  FINDINGS: There is a 1.1 cm in diameter calcification just below the L1 level on the the left consistent with the proximal left ureteral stone seen on CT scan. Additional punctate nonobstructing stone in the lower pole of the left kidney is not visible on plain films. No other abnormal abdominal calcifications are seen. The bowel gas pattern is nonobstructive. No focal bony abnormality.  IMPRESSION: 1.1 cm proximal left ureteral stone as seen on CT scan.   Electronically Signed   By: Drusilla Kanner M.D.   On: 09/05/2013 15:23    Assessment/Plan Principal Problem:   Kidney stone Active Problems:   Hydronephrosis, left   Ureteric colic   1. Left kidney stone with associated mild hydronephrosis and ureteric colic. Patient wishes supportively with IV fluids as well as pain medications. We'll provide her with Toradol as well as IV morphine. She'll receive antiemetics. She'll be kept n.p.o. after midnight for probable ureteral stent placement tomorrow. Urology has been consulted and is following the patient. Anticipate discharge home after the procedure.  Code Status: full code Family Communication: discussed with patient Disposition Plan: discharge home after procedure  Time spent:  [facility] Pager [phone]

## 2013-09-05 NOTE — ED Notes (Signed)
Pt informed of room assignment-[id number]

## 2013-09-05 NOTE — ED Notes (Signed)
Pt requesting pain meds, EDP made aware.  

## 2013-09-05 NOTE — ED Provider Notes (Signed)
CSN: [id number]     Arrival date & time 09/05/13  1129 History   First MD Initiated Contact with Patient 09/05/13 1329     Chief Complaint  Patient presents with  . Abdominal Pain   (Consider location/radiation/quality/duration/timing/severity/associated sxs/prior Treatment) Patient is a 33 y.o. female presenting with abdominal pain. The history is provided by the patient (the pt complains of left lower quad pain.  she has a hx of kidney stones).  Abdominal Pain Pain location:  LUQ Pain quality: aching   Pain radiates to:  Does not radiate Pain severity:  Severe Onset quality:  Sudden Timing:  Constant Progression:  Waxing and waning Chronicity:  New Context: not alcohol use   Associated symptoms: no chest pain, no cough, no diarrhea, no fatigue and no hematuria     Past Medical History  Diagnosis Date  . Kidney stone   . Kidney stone    Past Surgical History  Procedure Laterality Date  . Tubal ligation    . Tubal ligation    . Breast surgery     No family history on file. History  Substance Use Topics  . Smoking status: Never Smoker   . Smokeless tobacco: Not on file  . Alcohol Use: No   OB History   Grav Para Term Preterm Abortions TAB SAB Ect Mult Living                 Review of Systems  Constitutional: Negative for appetite change and fatigue.  HENT: Negative for congestion, ear discharge and sinus pressure.   Eyes: Negative for discharge.  Respiratory: Negative for cough.   Cardiovascular: Negative for chest pain.  Gastrointestinal: Positive for abdominal pain. Negative for diarrhea.  Genitourinary: Negative for frequency and hematuria.  Musculoskeletal: Negative for back pain.  Skin: Negative for rash.  Neurological: Negative for seizures and headaches.  Psychiatric/Behavioral: Negative for hallucinations.    Allergies  Review of patient's allergies indicates no known allergies.  Home Medications   Current Outpatient Rx  Name  Route  Sig   Dispense  Refill  . Diphenhydramine-APAP, sleep, (ACETAMINOPHEN PM EX ST PO)   Oral   Take 2 capsules by mouth at bedtime as needed (pain).          BP 106/69  Pulse 77  Temp(Src) 97.9 F (36.6 C) (Oral)  Resp 20  Ht 5\' 5"  (1.651 m)  Wt 155 lb (70.308 kg)  BMI 25.79 kg/m2  SpO2 100%  LMP 09/04/2013 Physical Exam  Constitutional: She is oriented to person, place, and time. She appears well-developed.  HENT:  Head: Normocephalic.  Eyes: Conjunctivae and EOM are normal. No scleral icterus.  Neck: Neck supple. No thyromegaly present.  Cardiovascular: Normal rate and regular rhythm.  Exam reveals no gallop and no friction rub.   No murmur heard. Pulmonary/Chest: No stridor. She has no wheezes. She has no rales. She exhibits no tenderness.  Abdominal: She exhibits no distension. There is tenderness. There is no rebound.  Tender llq  Genitourinary:  Tender left flank  Musculoskeletal: Normal range of motion. She exhibits no edema.  Lymphadenopathy:    She has no cervical adenopathy.  Neurological: She is oriented to person, place, and time. She exhibits normal muscle tone. Coordination normal.  Skin: No rash noted. No erythema.  Psychiatric: She has a normal mood and affect. Her behavior is normal.    ED Course  Procedures (including critical care time) Labs Review Labs Reviewed  BASIC METABOLIC PANEL - Abnormal; Notable for  the following:    Glucose, Bld 102 (*)    All other components within normal limits  URINALYSIS, ROUTINE W REFLEX MICROSCOPIC - Abnormal; Notable for the following:    Specific Gravity, Urine >1.030 (*)    Hgb urine dipstick TRACE (*)    All other components within normal limits  URINE MICROSCOPIC-ADD ON - Abnormal; Notable for the following:    Squamous Epithelial / LPF FEW (*)    All other components within normal limits  CBC WITH DIFFERENTIAL  PREGNANCY, URINE   Imaging Review Ct Abdomen Pelvis Wo Contrast  09/05/2013   CLINICAL DATA:  Left  flank pain  EXAM: CT ABDOMEN AND PELVIS WITHOUT CONTRAST  TECHNIQUE: Multidetector CT imaging of the abdomen and pelvis was performed following the standard protocol without IV contrast.  COMPARISON:  05/23/2011  FINDINGS: 12 mm calculus at the left ureteropelvic junction is associated with mild left hydronephrosis. Tiny calculus in the lower pole of the left kidney. No right ureteral calculi.  Left adnexal cyst is not significantly changed.  Liver, gallbladder, spleen, pancreas, adrenal glands are within normal limits.  Normal appendix.  Bladder, right adnexa, and uterus are within normal limits.  No destructive bone lesion.  L5 is partially sacralized.  IMPRESSION: Left ureteropelvic junction calculus is associated with mild left hydronephrosis.  Left nephrolithiasis.   Electronically Signed   By: Maryclare Bean M.D.   On: 09/05/2013 14:08   Dg Abd 1 View  09/05/2013   CLINICAL DATA:  Left flank pain.  History of urinary tract stones.  EXAM: ABDOMEN - 1 VIEW  COMPARISON:  CT abdomen and pelvis 09/05/2013.  FINDINGS: There is a 1.1 cm in diameter calcification just below the L1 level on the the left consistent with the proximal left ureteral stone seen on CT scan. Additional punctate nonobstructing stone in the lower pole of the left kidney is not visible on plain films. No other abnormal abdominal calcifications are seen. The bowel gas pattern is nonobstructive. No focal bony abnormality.  IMPRESSION: 1.1 cm proximal left ureteral stone as seen on CT scan.   Electronically Signed   By: Drusilla Kanner M.D.   On: 09/05/2013 15:23    EKG Interpretation   None       MDM   1. Kidney stone        Benny Lennert, MD 09/05/13 [phone]

## 2013-09-05 NOTE — ED Notes (Signed)
Pt reports severe left lower quad ab pain that started 1 hour pta, denies any urinary s/s, no fever, last bm yesterday. No vaginal discharge

## 2013-09-06 ENCOUNTER — Other Ambulatory Visit: Payer: Self-pay | Admitting: Urology

## 2013-09-06 ENCOUNTER — Observation Stay (HOSPITAL_COMMUNITY): Payer: Self-pay | Admitting: Anesthesiology

## 2013-09-06 ENCOUNTER — Ambulatory Visit (HOSPITAL_COMMUNITY)
Admission: RE | Admit: 2013-09-06 | Discharge: 2013-09-06 | Disposition: A | Discharge status: Home / Self Care | Payer: Self-pay | Source: Ambulatory Visit | Attending: Urology | Admitting: Urology

## 2013-09-06 ENCOUNTER — Encounter (HOSPITAL_COMMUNITY)
Admission: EM | Disposition: A | Discharge status: Home / Self Care | Payer: Self-pay | Source: Home / Self Care | Attending: Internal Medicine

## 2013-09-06 ENCOUNTER — Encounter (HOSPITAL_COMMUNITY): Payer: Self-pay | Admitting: *Deleted

## 2013-09-06 ENCOUNTER — Encounter (HOSPITAL_COMMUNITY): Payer: MEDICAID | Admitting: Anesthesiology

## 2013-09-06 DIAGNOSIS — N201 Calculus of ureter: Secondary | ICD-10-CM

## 2013-09-06 HISTORY — PX: CYSTOSCOPY WITH STENT PLACEMENT: SHX5790

## 2013-09-06 LAB — BASIC METABOLIC PANEL
BUN: 11 mg/dL (ref 6–23)
CHLORIDE: 109 meq/L (ref 96–112)
CO2: 23 mEq/L (ref 19–32)
Calcium: 9.3 mg/dL (ref 8.4–10.5)
Creatinine, Ser: 0.76 mg/dL (ref 0.50–1.10)
GFR calc Af Amer: >90 mL/min (ref >90)
GFR calc non Af Amer: >90 mL/min (ref >90)
Glucose, Bld: 85 mg/dL (ref 70–99)
POTASSIUM: 4.4 meq/L (ref 3.7–5.3)
Sodium: 142 mEq/L (ref 137–147)

## 2013-09-06 SURGERY — CYSTOSCOPY, WITH STENT INSERTION
Anesthesia: General | Site: Ureter | Laterality: Left

## 2013-09-06 MED ORDER — FENTANYL CITRATE 0.05 MG/ML IJ SOLN
INTRAMUSCULAR | Status: AC
  Filled 2013-09-06: qty 2

## 2013-09-06 MED ORDER — STERILE WATER FOR IRRIGATION IR SOLN
Status: DC | PRN
  Administered 2013-09-06: 1000 mL

## 2013-09-06 MED ORDER — MIDAZOLAM HCL 2 MG/2ML IJ SOLN
INTRAMUSCULAR | Status: AC
  Filled 2013-09-06: qty 2

## 2013-09-06 MED ORDER — PROPOFOL 10 MG/ML IV EMUL
INTRAVENOUS | Status: AC
  Filled 2013-09-06: qty 20

## 2013-09-06 MED ORDER — FENTANYL CITRATE 0.05 MG/ML IJ SOLN
INTRAMUSCULAR | Status: AC
  Administered 2013-09-06: 25 ug via INTRAVENOUS
  Filled 2013-09-06: qty 2

## 2013-09-06 MED ORDER — PROPOFOL 10 MG/ML IV BOLUS
INTRAVENOUS | Status: DC | PRN
  Administered 2013-09-06 (×2): 8 mg via INTRAVENOUS
  Administered 2013-09-06: 20 mg via INTRAVENOUS
  Administered 2013-09-06: 8 mg via INTRAVENOUS

## 2013-09-06 MED ORDER — OXYCODONE-ACETAMINOPHEN 7.5-325 MG PO TABS: 1.0000 | ORAL_TABLET | Freq: Four times a day (QID) | ORAL | Status: DC | PRN

## 2013-09-06 MED ORDER — LIDOCAINE HCL 2 % EX GEL
CUTANEOUS | Status: DC | PRN
  Administered 2013-09-06: 1 via TOPICAL

## 2013-09-06 MED ORDER — IOHEXOL 350 MG/ML SOLN
INTRAVENOUS | Status: DC | PRN
  Administered 2013-09-06: 50 mL via URETHRAL

## 2013-09-06 MED ORDER — FENTANYL CITRATE 0.05 MG/ML IJ SOLN
25.0000 ug | INTRAMUSCULAR | Status: AC
  Administered 2013-09-06 (×2): 25 ug via INTRAVENOUS

## 2013-09-06 MED ORDER — LACTATED RINGERS IV SOLN
INTRAVENOUS | Status: DC
  Administered 2013-09-06: 15:00:00 via INTRAVENOUS

## 2013-09-06 MED ORDER — FENTANYL CITRATE 0.05 MG/ML IJ SOLN
25.0000 ug | INTRAMUSCULAR | Status: DC | PRN
  Administered 2013-09-06 (×4): 50 ug via INTRAVENOUS

## 2013-09-06 MED ORDER — MIDAZOLAM HCL 5 MG/5ML IJ SOLN
INTRAMUSCULAR | Status: DC | PRN
  Administered 2013-09-06 (×2): 1 mg via INTRAVENOUS

## 2013-09-06 MED ORDER — LIDOCAINE VISCOUS 2 % MT SOLN
OROMUCOSAL | Status: AC
  Filled 2013-09-06: qty 15

## 2013-09-06 MED ORDER — MIDAZOLAM HCL 2 MG/2ML IJ SOLN
1.0000 mg | INTRAMUSCULAR | Status: DC | PRN
  Administered 2013-09-06: 2 mg via INTRAVENOUS

## 2013-09-06 MED ORDER — PROPOFOL INFUSION 10 MG/ML OPTIME
INTRAVENOUS | Status: DC | PRN
  Administered 2013-09-06: 200 ug/kg/min via INTRAVENOUS
  Administered 2013-09-06: 125 ug/kg/min via INTRAVENOUS

## 2013-09-06 MED ORDER — PROPOFOL 10 MG/ML IV BOLUS
INTRAVENOUS | Status: AC
  Filled 2013-09-06: qty 20

## 2013-09-06 MED ORDER — CIPROFLOXACIN IN D5W 200 MG/100ML IV SOLN
INTRAVENOUS | Status: AC
  Filled 2013-09-06: qty 100

## 2013-09-06 MED ORDER — ONDANSETRON HCL 4 MG/2ML IJ SOLN
4.0000 mg | Freq: Once | INTRAMUSCULAR | Status: DC | PRN
Start: 2013-09-06 — End: 2013-09-06

## 2013-09-06 MED ORDER — FENTANYL CITRATE 0.05 MG/ML IJ SOLN
INTRAMUSCULAR | Status: DC | PRN
  Administered 2013-09-06 (×2): 50 ug via INTRAVENOUS
  Administered 2013-09-06: 25 ug via INTRAVENOUS
  Administered 2013-09-06 (×2): 12.5 ug via INTRAVENOUS

## 2013-09-06 SURGICAL SUPPLY — 15 items
BAG DRAIN URO TABLE W/ADPT NS (DRAPE) ×3 IMPLANT
BAG DRN 8 ADPR NS SKTRN CSTL (DRAPE) ×1
CATH OPEN TIP 5FR (CATHETERS) ×3 IMPLANT
CLOTH BEACON ORANGE TIMEOUT ST (SAFETY) ×3 IMPLANT
GLOVE BIO SURGEON STRL SZ7 (GLOVE) ×3 IMPLANT
GOWN STRL REIN XL XLG (GOWN DISPOSABLE) ×3 IMPLANT
IV NS IRRIG 3000ML ARTHROMATIC (IV SOLUTION) ×6 IMPLANT
KIT ROOM TURNOVER AP CYSTO (KITS) ×3 IMPLANT
MANIFOLD NEPTUNE II (INSTRUMENTS) ×3 IMPLANT
PACK CYSTO (CUSTOM PROCEDURE TRAY) ×3 IMPLANT
PAD ARMBOARD 7.5X6 YLW CONV (MISCELLANEOUS) ×3 IMPLANT
SET IRRIGATING DISP (SET/KITS/TRAYS/PACK) ×3 IMPLANT
STENT PERCUFLEX 4.8FRX24 (STENTS) ×2 IMPLANT
TOWEL OR 17X26 4PK STRL BLUE (TOWEL DISPOSABLE) ×3 IMPLANT
WIRE GUIDE BENTSON .035 15CM (WIRE) ×3 IMPLANT

## 2013-09-06 NOTE — Anesthesia Procedure Notes (Addendum)
Procedure Name: MAC Date/Time: 09/06/2013 4:34 PM Performed by: Franco NonesYATES, Eddy Liszewski S Pre-anesthesia Checklist: Patient identified, Emergency Drugs available, Suction available, Timeout performed and Patient being monitored Patient Re-evaluated:Patient Re-evaluated prior to inductionOxygen Delivery Method: Non-rebreather mask    Date/Time: 09/06/2013 4:57 PM Performed by: Franco NonesYATES, Jamear Carbonneau S Pre-anesthesia Checklist: Patient identified, Emergency Drugs available, Suction available, Patient being monitored and Timeout performed Patient Re-evaluated:Patient Re-evaluated prior to inductionOxygen Delivery Method: Circle system utilized Ventilation: Mask ventilation without difficulty Comments: Mask airway spontaneous respirations

## 2013-09-06 NOTE — Brief Op Note (Signed)
09/05/2013 - 09/06/2013  5:13 PM  PATIENT:  [name] HallerAmanda A Koch  33 y.o. female  PRE-OPERATIVE DIAGNOSIS:  l flank obstructing l renal calculus  POST-OPERATIVE DIAGNOSIS:  * No post-op diagnosis entered *  PROCEDURE:  Procedure(s): CYSTOSCOPY WITH STENT PLACEMENT (Left)  SURGEON:  Surgeon(s) and Role:    * Ky BarbanMohammad I Miamor Ayler, MD - Primary  PHYSICIAN ASSISTANT:   ASSISTANTS: none   ANESTHESIA:   general  EBL:  Total I/O In: 400 [I.V.:400] Out: 0   BLOOD ADMINISTERED:none  DRAINS: double j stentno string    LOCAL MEDICATIONS USED:  NONE  SPECIMEN:  No Specimen  DISPOSITION OF SPECIMEN:  N/A  COUNTS:  YES  TOURNIQUET:  * No tourniquets in log *  DICTATION: .Other Dictation: Dictation Number dictation [id number]  PLAN OF CARE: Discharge to home after PACU  PATIENT DISPOSITION:  PACU - hemodynamically stable.   Delay start of Pharmacological VTE agent (>24hrs) due to surgical blood loss or risk of bleeding:

## 2013-09-06 NOTE — OR Nursing (Signed)
Day 3 of period

## 2013-09-06 NOTE — Discharge Instructions (Signed)
Call if pain or fever see in office on Monday afternoon to schedule esl

## 2013-09-06 NOTE — Anesthesia Postprocedure Evaluation (Signed)
Anesthesia Post Note  Patient: [name] Koch  Procedure(s) Performed: Procedure(s) (LRB): CYSTOSCOPY WITH STENT PLACEMENT (Left)  Anesthesia type: General  Patient location: PACU  Post pain: Pain level controlled  Post assessment: Post-op Vital signs reviewed, Patient's Cardiovascular Status Stable, Respiratory Function Stable, Patent Airway, No signs of Nausea or vomiting and Pain level controlled  Last Vitals:  Filed Vitals:   09/06/13 1721  BP: 123/63  Pulse: 75  Temp: 36.5 C  Resp: 20    Post vital signs: Reviewed and stable  Level of consciousness: awake and alert   Complications: No apparent anesthesia complications

## 2013-09-06 NOTE — Care Management Note (Signed)
    Page 1 of 1   09/06/2013     11:56:20 AM   CARE MANAGEMENT NOTE 09/06/2013  Patient:  [name] Koch,[name] Koch   Account Number:  [id number]  Date Initiated:  09/06/2013  Documentation initiated by:  Sharrie RothmanBLACKWELL,Lenville Hibberd C  Subjective/Objective Assessment:   Pt admitted from home with kidney stones and hydronephrosis. Pt lives with her parents and has 2 young children in the home. Pt is independent with ADL's.     Action/Plan:   Financial counselor aware of self pay status and will contact pt. No other needs noted.   Anticipated DC Date:  09/07/2013   Anticipated DC Plan:  HOME/SELF CARE      DC Planning Services  CM consult      Choice offered to / List presented to:             Status of service:  Completed, signed off Medicare Important Message given?   (If response is "NO", the following Medicare IM given date fields will be blank) Date Medicare IM given:   Date Additional Medicare IM given:    Discharge Disposition:  HOME/SELF CARE  Per UR Regulation:    If discussed at Long Length of Stay Meetings, dates discussed:    Comments:  09/06/13 1155 Arlyss Queenammy Haidan Nhan, RN BSN CM

## 2013-09-06 NOTE — Progress Notes (Signed)
Patient states understanding of discharge instructions, prescription given. 

## 2013-09-06 NOTE — Consult Note (Signed)
[name], [name]              ACCOUNT NO.:  [id number]  MEDICAL RECORD NO.:  [id number]  LOCATION:  A308                          FACILITY:  [facility]  PHYSICIAN:  Ky BarbanMohammad I. Ulyssa Walthour, M.D.DATE OF BIRTH:  05/30/1981  DATE OF CONSULTATION:  09/05/2013 DATE OF DISCHARGE:                                CONSULTATION   CHIEF COMPLAINT:  Left renal colic.  HISTORY:  A 33 year old female who says she has a history of having kidney stones.  She passed a stone from the right side in a couple of years ago.  Then all of a sudden today, she started to have severe pain on the left side, so she was nausea, no vomiting, fever, chills, or any gross hematuria, came to the emergency room where a CT scan shows she has a large 12 mm calculus at the left ureteropelvic junction associated with mild left hydronephrosis.  There is a tiny calculus in the lower pole of the left kidney.  No right renal or ureteral calculi. __________  PAST MEDICAL HISTORY:  No history of diabetes or hypertension.  SURGICAL HISTORY:  She has tubal ligation few years ago.  PERSONAL HISTORY:  She never smoked.  Do not use any alcohol or any illicit drugs.  REVIEW OF SYSTEMS:  Unremarkable.  PHYSICAL EXAMINATION:  VITAL SIGNS:  Blood pressure is 106/69, temperature 97.9, fully conscious, alert, oriented, in moderate distress. ABDOMEN:  Soft and flat.  Liver, spleen, and kidneys are not palpable. BACK:  1+ left CVA tenderness. PELVIC:  Deferred. EXTREMITIES:  Normal.  Urinalysis shows positive dipstick for hemoglobin.  CT scan shows a 12 mm stone in the left ureteropelvic junction causes mild left hydronephrosis.  There is a tiny calculus in the lower pole of calix on the left side.  No stone on the right side.  Stone is easily visible on KUB.  IMPRESSION:  Left ureteropelvic junction calculus.  PLAN:  Double-J stent, then she can go home with a stent on oral pain medicine and __________ as outpatient through the  office.     Ky BarbanMohammad I. Gurnoor Sloop, M.D.     MIJ/MEDQ  D:  09/05/2013  T:  09/06/2013  Job:  [id number]

## 2013-09-06 NOTE — Anesthesia Preprocedure Evaluation (Signed)
Anesthesia Evaluation  Patient identified by MRN, date of birth, ID band Patient awake    Reviewed: Allergy & Precautions, H&P , NPO status , Patient's Chart, lab work & pertinent test results  Airway Mallampati: I TM Distance: >3 FB Neck ROM: Full    Dental  (+) Teeth Intact   Pulmonary neg pulmonary ROS,  breath sounds clear to auscultation        Cardiovascular negative cardio ROS  Rhythm:Regular Rate:Normal     Neuro/Psych    GI/Hepatic negative GI ROS,   Endo/Other    Renal/GU Renal disease     Musculoskeletal   Abdominal   Peds  Hematology   Anesthesia Other Findings   Reproductive/Obstetrics                           Anesthesia Physical Anesthesia Plan  ASA: I  Anesthesia Plan: MAC   Post-op Pain Management:    Induction: Intravenous  Airway Management Planned: Nasal Cannula  Additional Equipment:   Intra-op Plan:   Post-operative Plan: Extubation in OR  Informed Consent: I have reviewed the patients History and Physical, chart, labs and discussed the procedure including the risks, benefits and alternatives for the proposed anesthesia with the patient or authorized representative who has indicated his/her understanding and acceptance.     Plan Discussed with:   Anesthesia Plan Comments:         Anesthesia Quick Evaluation

## 2013-09-06 NOTE — Transfer of Care (Signed)
Immediate Anesthesia Transfer of Care Note  Patient: [name] HallerAmanda A Vining  Procedure(s) Performed: Procedure(s) (LRB): CYSTOSCOPY WITH STENT PLACEMENT (Left)  Patient Location: PACU  Anesthesia Type: General  Level of Consciousness: awake  Airway & Oxygen Therapy: Patient Spontanous Breathing and non-rebreather face mask  Post-op Assessment: Report given to PACU RN, Post -op Vital signs reviewed and stable and Patient moving all extremities  Post vital signs: Reviewed and stable  Complications: No apparent anesthesia complications

## 2013-09-06 NOTE — Discharge Summary (Signed)
Physician Discharge Summary  [name] Koch BSW:[id number] DOB: [birth year] DOA: 09/05/2013  PCP: No PCP Per Patient  Admit date: 09/05/2013 Discharge date: 09/06/2013  Time spent: 45 minutes  Recommendations for Outpatient Follow-up:  1. Follow up with Dr. Jerre SimonJavaid on monday  Discharge Diagnoses:  Principal Problem:   Kidney stone Active Problems:   Hydronephrosis, left   Ureteric colic   Discharge Condition: improved  Diet recommendation: regular diet  Filed Weights   09/05/13 1148  Weight: 70.308 kg (155 lb)    History of present illness:  [name] Koch is a 33 y.o. female with no significant past medical history except for history of nephrolithiasis in the past. Patient presents to the emergency room today with acute onset of left lower corner abdominal pain. She reports her symptoms began between 10 and 11:00 this morning. She had associated vomiting. No fever. No hematuria, diarrhea, shortness of breath, cough or any other complaints. Her pain has been fluctuating, but consistently present. She evaluated the emergency room where imaging indicated a left ureteropelvic junction stone with mild left-sided hydronephrosis. The patient has been admitted to the hospital for further treatment   Hospital Course:  Patient was admitted to the hospital for supportive treatment.  She received IV fluids, pain management and anti emetics. She was seen by Dr. Jerre SimonJavaid and underwent cystoscopy with JJ stent in left ureter for UPJ stone. Patient tolerated procedure well without immediate complications. Plans are to follow up with urology on Monday for lithotripsy.  She has been cleared for discharge by urology.  Procedures:  Cystoscopy with left double-J stent  Consultations:  Urology  Discharge Exam: Filed Vitals:   09/06/13 1800  BP:   Pulse: 56  Temp: 97.7 F (36.5 C)  Resp: 13    General: NAD Cardiovascular: S1, S2 RR Respiratory: CTA B  Discharge Instructions  Discharge  Orders   Future Orders Complete By Expires   Call MD for:  persistant nausea and vomiting  As directed    Call MD for:  severe uncontrolled pain  As directed    Call MD for:  temperature >100.4  As directed    Diet general  As directed    Increase activity slowly  As directed        Medication List         ACETAMINOPHEN PM EX ST PO  Take 2 capsules by mouth at bedtime as needed (pain).     oxyCODONE-acetaminophen 7.5-325 MG per tablet  Commonly known as:  PERCOCET  Take 1 tablet by mouth every 6 (six) hours as needed for pain.       No Known Allergies     Follow-up Information   Follow up In 4 days.       The results of significant diagnostics from this hospitalization (including imaging, microbiology, ancillary and laboratory) are listed below for reference.    Significant Diagnostic Studies: Ct Abdomen Pelvis Wo Contrast  09/05/2013   CLINICAL DATA:  Left flank pain  EXAM: CT ABDOMEN AND PELVIS WITHOUT CONTRAST  TECHNIQUE: Multidetector CT imaging of the abdomen and pelvis was performed following the standard protocol without IV contrast.  COMPARISON:  05/23/2011  FINDINGS: 12 mm calculus at the left ureteropelvic junction is associated with mild left hydronephrosis. Tiny calculus in the lower pole of the left kidney. No right ureteral calculi.  Left adnexal cyst is not significantly changed.  Liver, gallbladder, spleen, pancreas, adrenal glands are within normal limits.  Normal appendix.  Bladder, right adnexa,  and uterus are within normal limits.  No destructive bone lesion.  L5 is partially sacralized.  IMPRESSION: Left ureteropelvic junction calculus is associated with mild left hydronephrosis.  Left nephrolithiasis.   Electronically Signed   By: Maryclare Bean M.D.   On: 09/05/2013 14:08   Dg Abd 1 View  09/05/2013   CLINICAL DATA:  Left flank pain.  History of urinary tract stones.  EXAM: ABDOMEN - 1 VIEW  COMPARISON:  CT abdomen and pelvis 09/05/2013.  FINDINGS: There is a 1.1  cm in diameter calcification just below the L1 level on the the left consistent with the proximal left ureteral stone seen on CT scan. Additional punctate nonobstructing stone in the lower pole of the left kidney is not visible on plain films. No other abnormal abdominal calcifications are seen. The bowel gas pattern is nonobstructive. No focal bony abnormality.  IMPRESSION: 1.1 cm proximal left ureteral stone as seen on CT scan.   Electronically Signed   By: Drusilla Kanner M.D.   On: 09/05/2013 15:23    Microbiology: No results found for this or any previous visit (from the past 240 hour(s)).   Labs: Basic Metabolic Panel:  Recent Labs Lab 09/05/13 1203 09/06/13 0513  NA 140 142  K 3.9 4.4  CL 103 109  CO2 25 23  GLUCOSE 102* 85  BUN 13 11  CREATININE 0.82 0.76  CALCIUM 10.2 9.3   Liver Function Tests: No results found for this basename: AST, ALT, ALKPHOS, BILITOT, PROT, ALBUMIN,  in the last 168 hours No results found for this basename: LIPASE, AMYLASE,  in the last 168 hours No results found for this basename: AMMONIA,  in the last 168 hours CBC:  Recent Labs Lab 09/05/13 1203  WBC 5.1  NEUTROABS 3.4  HGB 13.7  HCT 42.1  MCV 90.0  PLT 180   Cardiac Enzymes: No results found for this basename: CKTOTAL, CKMB, CKMBINDEX, TROPONINI,  in the last 168 hours BNP: BNP (last 3 results) No results found for this basename: PROBNP,  in the last 8760 hours CBG: No results found for this basename: GLUCAP,  in the last 168 hours     Signed:  MEMON,JEHANZEB  [facility] 09/06/2013, 6:20 PM

## 2013-09-06 NOTE — OR Nursing (Signed)
Patient on period and removed tampon ,  Wearing maxipad with  Mesh panties

## 2013-09-07 NOTE — Op Note (Signed)
NAME:  Hart RochesterALBERT, Rashawna                   ACCOUNT NO.:  MEDICAL RECORD NO.:  [id number]  LOCATION:                                 FACILITY:  PHYSICIAN:  Ky BarbanMohammad I. Diantha Paxson, M.D.DATE OF BIRTH:  December 11, 1980  DATE OF PROCEDURE: DATE OF DISCHARGE:                              OPERATIVE REPORT   PREOPERATIVE DIAGNOSIS:  Left renal calculus.  POSTOPERATIVE DIAGNOSIS:  Left renal calculus.  PROCEDURE:  Cystoscopy, insertion of left retrograde pyelogram with interpretation.  ANESTHESIA:  IV MAC.  DESCRIPTION OF PROCEDURE:  The patient under general anesthesia in lithotomy position.  After usual prep and drape, a #25 cystoscope was introduced into the bladder.  Left ureteral orifice was catheterized with an open-end catheter and a guidewire was advanced up into the renal pelvis.  The catheter was passed up into the upper ureter below the stone.  Dye was injected and it outlines the stone and the upper ureter, but did not see any dilated ureter.  The upper ureter was normal in caliber.  The stone appears to be in the upper part of the ureter, maybe a couple inches below the UPJ.  Now a guidewire was passed up into the renal pelvis and the catheter was advanced into the renal pelvis and pushed the stone away from that upper ureter and hydronephrotic drip was obtained.  The dye was injected again and it outlined the upper pole calyceal system.  Now the guidewire was reintroduced and open-end catheter was removed and over the guidewire, a 24 cm 5-French double-J stent was advanced under fluoroscopic control.  It was positioned between the renal pelvis and the bladder.  Nice loop was obtained in the renal pelvis and the bladder after removing the guidewire.  All the instruments were removed.     Ky BarbanMohammad I. Rosalyn Archambault, M.D.     MIJ/MEDQ  D:  09/06/2013  T:  09/07/2013  Job:  [id number]

## 2013-09-10 ENCOUNTER — Encounter (HOSPITAL_COMMUNITY): Payer: Self-pay | Admitting: Urology

## 2013-09-11 NOTE — Progress Notes (Signed)
UR chart review completed.  

## 2013-10-15 ENCOUNTER — Emergency Department (HOSPITAL_COMMUNITY): Payer: Self-pay

## 2013-10-15 ENCOUNTER — Encounter (HOSPITAL_COMMUNITY): Payer: Self-pay | Admitting: Emergency Medicine

## 2013-10-15 ENCOUNTER — Emergency Department (HOSPITAL_COMMUNITY)
Admission: EM | Admit: 2013-10-15 | Discharge: 2013-10-15 | Disposition: A | Discharge status: Home / Self Care | Payer: Self-pay | Attending: Emergency Medicine | Admitting: Emergency Medicine

## 2013-10-15 DIAGNOSIS — Z9851 Tubal ligation status: Secondary | ICD-10-CM | POA: Insufficient documentation

## 2013-10-15 DIAGNOSIS — N200 Calculus of kidney: Secondary | ICD-10-CM | POA: Insufficient documentation

## 2013-10-15 LAB — URINALYSIS, ROUTINE W REFLEX MICROSCOPIC
Bilirubin Urine: NEGATIVE
GLUCOSE, UA: NEGATIVE mg/dL
Ketones, ur: NEGATIVE mg/dL
NITRITE: POSITIVE — AB
Protein, ur: 100 mg/dL — AB
SPECIFIC GRAVITY, URINE: 1.025 (ref 1.005–1.030)
Urobilinogen, UA: 1 mg/dL (ref 0.0–1.0)
pH: 6.5 (ref 5.0–8.0)

## 2013-10-15 LAB — POCT I-STAT, CHEM 8
BUN: 14 mg/dL (ref 6–23)
CALCIUM ION: 1.4 mmol/L — AB (ref 1.12–1.23)
CHLORIDE: 100 meq/L (ref 96–112)
Creatinine, Ser: 1.1 mg/dL (ref 0.50–1.10)
GLUCOSE: 93 mg/dL (ref 70–99)
HEMATOCRIT: 36 % (ref 36.0–46.0)
Hemoglobin: 12.2 g/dL (ref 12.0–15.0)
POTASSIUM: 4.2 meq/L (ref 3.7–5.3)
Sodium: 140 mEq/L (ref 137–147)
TCO2: 28 mmol/L (ref 0–100)

## 2013-10-15 LAB — URINE MICROSCOPIC-ADD ON

## 2013-10-15 MED ORDER — CEPHALEXIN 500 MG PO CAPS: 500.0000 mg | ORAL_CAPSULE | Freq: Two times a day (BID) | ORAL | Status: DC

## 2013-10-15 MED ORDER — HYDROCODONE-ACETAMINOPHEN 5-325 MG PO TABS
1.0000 | ORAL_TABLET | Freq: Once | ORAL | Status: AC
  Administered 2013-10-15: 1 via ORAL
  Filled 2013-10-15: qty 1

## 2013-10-15 MED ORDER — ACETAMINOPHEN-CODEINE #3 300-30 MG PO TABS: 1.0000 | ORAL_TABLET | Freq: Four times a day (QID) | ORAL | Status: DC | PRN

## 2013-10-15 NOTE — Discharge Instructions (Signed)
Kidney Stones  Kidney stones (urolithiasis) are deposits that form inside your kidneys. The intense pain is caused by the stone moving through the urinary tract. When the stone moves, the ureter goes into spasm around the stone. The stone is usually passed in the urine.   CAUSES   · A disorder that makes certain neck glands produce too much parathyroid hormone (primary hyperparathyroidism).  · A buildup of uric acid crystals, similar to gout in your joints.  · Narrowing (stricture) of the ureter.  · A kidney obstruction present at birth (congenital obstruction).  · Previous surgery on the kidney or ureters.  · Numerous kidney infections.  SYMPTOMS   · Feeling sick to your stomach (nauseous).  · Throwing up (vomiting).  · Blood in the urine (hematuria).  · Pain that usually spreads (radiates) to the groin.  · Frequency or urgency of urination.  DIAGNOSIS   · Taking a history and physical exam.  · Blood or urine tests.  · CT scan.  · Occasionally, an examination of the inside of the urinary bladder (cystoscopy) is performed.  TREATMENT   · Observation.  · Increasing your fluid intake.  · Extracorporeal shock wave lithotripsy This is a noninvasive procedure that uses shock waves to break up kidney stones.  · Surgery may be needed if you have severe pain or persistent obstruction. There are various surgical procedures. Most of the procedures are performed with the use of small instruments. Only small incisions are needed to accommodate these instruments, so recovery time is minimized.  The size, location, and chemical composition are all important variables that will determine the proper choice of action for you. Talk to your health care provider to better understand your situation so that you will minimize the risk of injury to yourself and your kidney.   HOME CARE INSTRUCTIONS   · Drink enough water and fluids to keep your urine clear or pale yellow. This will help you to pass the stone or stone fragments.  · Strain  all urine through the provided strainer. Keep all particulate matter and stones for your health care provider to see. The stone causing the pain may be as small as a grain of salt. It is very important to use the strainer each and every time you pass your urine. The collection of your stone will allow your health care provider to analyze it and verify that a stone has actually passed. The stone analysis will often identify what you can do to reduce the incidence of recurrences.  · Only take over-the-counter or prescription medicines for pain, discomfort, or fever as directed by your health care provider.  · Make a follow-up appointment with your health care provider as directed.  · Get follow-up X-rays if required. The absence of pain does not always mean that the stone has passed. It may have only stopped moving. If the urine remains completely obstructed, it can cause loss of kidney function or even complete destruction of the kidney. It is your responsibility to make sure X-rays and follow-ups are completed. Ultrasounds of the kidney can show blockages and the status of the kidney. Ultrasounds are not associated with any radiation and can be performed easily in a matter of minutes.  SEEK MEDICAL CARE IF:  · You experience pain that is progressive and unresponsive to any pain medicine you have been prescribed.  SEEK IMMEDIATE MEDICAL CARE IF:   · Pain cannot be controlled with the prescribed medicine.  · You have a fever   or shaking chills.  · The severity or intensity of pain increases over 18 hours and is not relieved by pain medicine.  · You develop a new onset of abdominal pain.  · You feel faint or pass out.  · You are unable to urinate.  MAKE SURE YOU:   · Understand these instructions.  · Will watch your condition.  · Will get help right away if you are not doing well or get worse.  Document Released: 08/16/2005 Document Revised: 04/18/2013 Document Reviewed: 01/17/2013  [employer]® Patient Information ©2014  [employer].

## 2013-10-15 NOTE — ED Notes (Signed)
Pt st's she was dx with a kidney stone in Jan.  St's they placed a stent at that time and was suppose to have the stent removed 4 days later but did not have the money to return to MD's office.  Pt st's tonight while working she developed pain in left lower abd.

## 2013-10-16 LAB — URINE CULTURE

## 2013-10-29 NOTE — ED Provider Notes (Signed)
CSN: [id number]     Arrival date & time 10/15/13  0145 History   First MD Initiated Contact with Patient 10/15/13 0232     Chief Complaint  Patient presents with  . Abdominal Pain     (Consider location/radiation/quality/duration/timing/severity/associated sxs/prior Treatment) HPI Comments: Pt comes in with cc of left flank pain. Pt had a renal stone last month, and a [facility] by the Urologist. Pt was unable to afford a f.u - and comes to the Ed as she has been having some left sided pain. No uti like sx - except may be some intermittent hematuria. No n/v/f/c.  Patient is a 33 y.o. female presenting with abdominal pain. The history is provided by the patient and medical records.  Abdominal Pain Associated symptoms: no chest pain, no dysuria, no nausea, no shortness of breath and no vomiting     Past Medical History  Diagnosis Date  . Kidney stone   . Kidney stone    Past Surgical History  Procedure Laterality Date  . Tubal ligation    . Tubal ligation    . Breast surgery    . Cystoscopy with stent placement Left 09/06/2013    Procedure: CYSTOSCOPY WITH STENT PLACEMENT;  Surgeon: Ky BarbanMohammad I Javaid, MD;  Location: [facility];  Service: Urology;  Laterality: Left;   No family history on file. History  Substance Use Topics  . Smoking status: Never Smoker   . Smokeless tobacco: Not on file  . Alcohol Use: No   OB History   Grav Para Term Preterm Abortions TAB SAB Ect Mult Living                 Review of Systems  Constitutional: Negative for activity change.  Respiratory: Negative for shortness of breath.   Cardiovascular: Negative for chest pain.  Gastrointestinal: Positive for abdominal pain. Negative for nausea and vomiting.  Genitourinary: Negative for dysuria.  Musculoskeletal: Positive for back pain. Negative for neck pain.  Neurological: Negative for headaches.      Allergies  Review of patient's allergies indicates no known allergies.  Home  Medications   Current Outpatient Rx  Name  Route  Sig  Dispense  Refill  . acetaminophen (TYLENOL) 500 MG tablet   Oral   Take 500 mg by mouth every 6 (six) hours as needed for mild pain or moderate pain.         Marland Kitchen. acetaminophen-codeine (TYLENOL #3) 300-30 MG per tablet   Oral   Take 1-2 tablets by mouth every 6 (six) hours as needed for moderate pain.   15 tablet   0   . cephALEXin (KEFLEX) 500 MG capsule   Oral   Take 1 capsule (500 mg total) by mouth 2 (two) times daily.   10 capsule   0    BP 105/65  Pulse 93  Temp(Src) 97.6 F (36.4 C) (Oral)  Resp 16  SpO2 99%  LMP 10/01/2013 Physical Exam  Nursing note and vitals reviewed. Constitutional: She is oriented to person, place, and time. She appears well-developed and well-nourished.  HENT:  Head: Normocephalic and atraumatic.  Eyes: EOM are normal. Pupils are equal, round, and reactive to light.  Neck: Neck supple.  Cardiovascular: Normal rate, regular rhythm and normal heart sounds.   No murmur heard. Pulmonary/Chest: Effort normal. No respiratory distress.  Abdominal: Soft. She exhibits no distension. There is no tenderness. There is no rebound and no guarding.  Neurological: She is alert and oriented to  person, place, and time.  Skin: Skin is warm and dry.    ED Course  Procedures (including critical care time) Labs Review Labs Reviewed  URINALYSIS, ROUTINE W REFLEX MICROSCOPIC - Abnormal; Notable for the following:    APPearance CLOUDY (*)    Hgb urine dipstick LARGE (*)    Protein, ur 100 (*)    Nitrite POSITIVE (*)    Leukocytes, UA MODERATE (*)    All other components within normal limits  URINE MICROSCOPIC-ADD ON - Abnormal; Notable for the following:    Bacteria, UA MANY (*)    Crystals CA OXALATE CRYSTALS (*)    All other components within normal limits  POCT I-STAT, CHEM 8 - Abnormal; Notable for the following:    Calcium, Ion 1.40 (*)    All other components within normal limits  URINE  CULTURE   Imaging Review No results found.   EKG Interpretation None      MDM   Final diagnoses:  Nephrolithiasis    Pt with left flank pain. Recent instrumentation with stent placement in the ureters for a stone. US renal and Cr are normal. UA is normal.  i emailed the urologist to accommodate patient for further care. Stable for discharge.  Derwood Kaplan, MD 10/29/13 1123

## 2013-11-05 ENCOUNTER — Ambulatory Visit (HOSPITAL_COMMUNITY)
Admission: RE | Admit: 2013-11-05 | Discharge: 2013-11-05 | Disposition: A | Discharge status: Home / Self Care | Payer: Self-pay | Source: Ambulatory Visit | Attending: Urology | Admitting: Urology

## 2013-11-05 ENCOUNTER — Other Ambulatory Visit (HOSPITAL_COMMUNITY): Payer: Self-pay | Admitting: Urology

## 2013-11-05 DIAGNOSIS — N200 Calculus of kidney: Secondary | ICD-10-CM

## 2014-03-21 ENCOUNTER — Emergency Department (HOSPITAL_COMMUNITY)
Admission: EM | Admit: 2014-03-21 | Discharge: 2014-03-21 | Disposition: A | Discharge status: Home / Self Care | Payer: Self-pay | Attending: Emergency Medicine | Admitting: Emergency Medicine

## 2014-03-21 ENCOUNTER — Emergency Department (HOSPITAL_COMMUNITY): Payer: Self-pay

## 2014-03-21 ENCOUNTER — Encounter (HOSPITAL_COMMUNITY): Payer: Self-pay | Admitting: Emergency Medicine

## 2014-03-21 DIAGNOSIS — Z87442 Personal history of urinary calculi: Secondary | ICD-10-CM | POA: Insufficient documentation

## 2014-03-21 DIAGNOSIS — G43009 Migraine without aura, not intractable, without status migrainosus: Secondary | ICD-10-CM | POA: Insufficient documentation

## 2014-03-21 MED ORDER — HYDROMORPHONE HCL PF 1 MG/ML IJ SOLN
1.0000 mg | Freq: Once | INTRAMUSCULAR | Status: AC
  Administered 2014-03-21: 1 mg via INTRAVENOUS
  Filled 2014-03-21: qty 1

## 2014-03-21 MED ORDER — METOCLOPRAMIDE HCL 5 MG/ML IJ SOLN
10.0000 mg | Freq: Once | INTRAMUSCULAR | Status: AC
  Administered 2014-03-21: 10 mg via INTRAVENOUS
  Filled 2014-03-21: qty 2

## 2014-03-21 MED ORDER — METHYLPREDNISOLONE SODIUM SUCC 125 MG IJ SOLR
125.0000 mg | Freq: Once | INTRAMUSCULAR | Status: AC
  Administered 2014-03-21: 125 mg via INTRAVENOUS
  Filled 2014-03-21: qty 2

## 2014-03-21 MED ORDER — KETOROLAC TROMETHAMINE 30 MG/ML IJ SOLN
30.0000 mg | Freq: Once | INTRAMUSCULAR | Status: AC
  Administered 2014-03-21: 30 mg via INTRAVENOUS
  Filled 2014-03-21: qty 1

## 2014-03-21 MED ORDER — DIPHENHYDRAMINE HCL 50 MG/ML IJ SOLN
25.0000 mg | Freq: Once | INTRAMUSCULAR | Status: AC
  Administered 2014-03-21: 25 mg via INTRAVENOUS
  Filled 2014-03-21: qty 1

## 2014-03-21 MED ORDER — SODIUM CHLORIDE 0.9 % IV BOLUS (SEPSIS)
500.0000 mL | Freq: Once | INTRAVENOUS | Status: AC
  Administered 2014-03-21: 500 mL via INTRAVENOUS

## 2014-03-21 MED ORDER — TRAMADOL HCL 50 MG PO TABS: 50.0000 mg | ORAL_TABLET | Freq: Four times a day (QID) | ORAL | Status: DC | PRN

## 2014-03-21 NOTE — ED Notes (Signed)
Patient complaining of headache x 3 days. Also complaining of vomiting.

## 2014-03-21 NOTE — ED Provider Notes (Signed)
CSN: [id number]     Arrival date & time 03/21/14  2023 History   First MD Initiated Contact with Patient 03/21/14 2033     Chief Complaint  Patient presents with  . Headache     (Consider location/radiation/quality/duration/timing/severity/associated sxs/prior Treatment) HPI Comments: Patient presented to the ER for evaluation of headache. Patient reports onset of headache 3 days ago. It is mainly behind the left eye and also in the back of the head. Patient reports constant throb. Pain has progressively worsened since it started. She reports associated light sensitivity, sound sensitivity, nausea and vomiting. She does have a history of migraine headaches, but has never had a collapsed lung. No numbness, tingling or weakness in the extremities. No neck pain or stiffness. No fever. Patient is told that she has had multiple members of her family, including her father, history of aneurysm in the brain.  Patient is a 33 y.o. female presenting with headaches.  Headache   Past Medical History  Diagnosis Date  . Kidney stone   . Kidney stone    Past Surgical History  Procedure Laterality Date  . Tubal ligation    . Tubal ligation    . Breast surgery    . Cystoscopy with stent placement Left 09/06/2013    Procedure: CYSTOSCOPY WITH STENT PLACEMENT;  Surgeon: Ky Barban, MD;  Location: [facility];  Service: Urology;  Laterality: Left;   History reviewed. No pertinent family history. History  Substance Use Topics  . Smoking status: Never Smoker   . Smokeless tobacco: Not on file  . Alcohol Use: No   OB History   Grav Para Term Preterm Abortions TAB SAB Ect Mult Living                 Review of Systems  Neurological: Positive for headaches.  All other systems reviewed and are negative.     Allergies  Review of patient's allergies indicates no known allergies.  Home Medications   Prior to Admission medications   Medication Sig Start Date End Date Taking? Authorizing  Provider  diphenhydramine-acetaminophen (TYLENOL PM) 25-500 MG TABS Take 1 tablet by mouth at bedtime as needed. sleep   Yes Historical Provider, MD  traMADol (ULTRAM) 50 MG tablet Take 1 tablet (50 mg total) by mouth every 6 (six) hours as needed. 03/21/14   Gilda Crease, MD   BP 119/67  Pulse 86  Temp(Src) 97.8 F (36.6 C) (Oral)  Resp 16  Ht 5\' 5"  (1.651 m)  Wt 154 lb 3.2 oz (69.945 kg)  BMI 25.66 kg/m2  SpO2 100%  LMP 03/16/2014 Physical Exam  Constitutional: She is oriented to person, place, and time. She appears well-developed and well-nourished. No distress.  HENT:  Head: Normocephalic and atraumatic.  Right Ear: Hearing normal.  Left Ear: Hearing normal.  Nose: Nose normal.  Mouth/Throat: Oropharynx is clear and moist and mucous membranes are normal.  Eyes: Conjunctivae and EOM are normal. Pupils are equal, round, and reactive to light.  Neck: Normal range of motion. Neck supple.  Cardiovascular: Regular rhythm, S1 normal and S2 normal.  Exam reveals no gallop and no friction rub.   No murmur heard. Pulmonary/Chest: Effort normal and breath sounds normal. No respiratory distress. She exhibits no tenderness.  Abdominal: Soft. Normal appearance and bowel sounds are normal. There is no hepatosplenomegaly. There is no tenderness. There is no rebound, no guarding, no tenderness at McBurney's point and negative Murphy's sign. No hernia.  Musculoskeletal: Normal range of motion.  Neurological: She is alert and oriented to person, place, and time. She has normal strength. No cranial nerve deficit or sensory deficit. Coordination normal. GCS eye subscore is 4. GCS verbal subscore is 5. GCS motor subscore is 6.  Extraocular muscle movement: normal No visual field cut Pupils: equal and reactive both direct and consensual response is normal No nystagmus present           Sensory function is intact to light touch, pinprick Proprioception intact  Grip strength 5/5 symmetric  in upper extremities Lower extremity strength 5/5 against gravity No pronator drift Normal finger to nose bilaterally Normal heel to shin bilaterally  Gait: normal    Skin: Skin is warm, dry and intact. No rash noted. No cyanosis.  Psychiatric: She has a normal mood and affect. Her speech is normal and behavior is normal. Thought content normal.    ED Course  Procedures (including critical care time) Labs Review Labs Reviewed - No data to display  Imaging Review Ct Head Wo Contrast  03/21/2014   CLINICAL DATA:  Frontal and occipital headaches for 3 days. History of migraine headaches.  EXAM: CT HEAD WITHOUT CONTRAST  TECHNIQUE: Contiguous axial images were obtained from the base of the skull through the vertex without intravenous contrast.  COMPARISON:  Head CT 04/12/2012.  FINDINGS: There is no evidence of acute intracranial hemorrhage, mass lesion, brain edema or extra-axial fluid collection. The ventricles and subarachnoid spaces are appropriately sized for age. There is no CT evidence of acute cortical infarction.  The visualized paranasal sinuses, mastoid air cells and middle ears are clear. The calvarium is intact.  IMPRESSION: Stable unremarkable noncontrast head CT.   Electronically Signed   By: Roxy HorsemanBill  Veazey M.D.   On: 03/21/2014 21:55     EKG Interpretation None      MDM   Final diagnoses:  Migraine without aura and without status migrainosus, not intractable    She presented to the ER for evaluation of headache. Symptoms are consistent with a migraine headache. She has had migraines in the past. Patient has a constellation of symptoms including throbbing, left frontal and periocular headache with nausea, vomiting, photosensitivity, light sensitivity. She has an unremarkable neurologic exam. Patient administered Toradol, Solu-Medrol, Reglan, Benadryl. Nausea improved and headache slightly improved. Because she did not have significant improvement, and has a family history  of aneurysm, CT scan was performed. It was negative. Currently the patient's symptoms were very consistent with migraine headache and therefore lumbar puncture is not considered necessary. Patient will be given additional analgesia, discharged home and followup as needed.    Gilda Creasehristopher J. Pollina, MD 03/21/14 [phone]

## 2014-03-21 NOTE — ED Notes (Signed)
Dr. Pollina at bedside   

## 2014-03-21 NOTE — Discharge Instructions (Signed)

## 2014-04-24 ENCOUNTER — Emergency Department (HOSPITAL_COMMUNITY): Payer: Self-pay

## 2014-04-24 ENCOUNTER — Emergency Department (HOSPITAL_COMMUNITY)
Admission: EM | Admit: 2014-04-24 | Discharge: 2014-04-24 | Disposition: A | Discharge status: Home / Self Care | Payer: Self-pay | Attending: Emergency Medicine | Admitting: Emergency Medicine

## 2014-04-24 ENCOUNTER — Encounter (HOSPITAL_COMMUNITY): Payer: Self-pay | Admitting: Emergency Medicine

## 2014-04-24 DIAGNOSIS — Z87442 Personal history of urinary calculi: Secondary | ICD-10-CM | POA: Insufficient documentation

## 2014-04-24 DIAGNOSIS — R040 Epistaxis: Secondary | ICD-10-CM | POA: Insufficient documentation

## 2014-04-24 DIAGNOSIS — G43009 Migraine without aura, not intractable, without status migrainosus: Secondary | ICD-10-CM | POA: Insufficient documentation

## 2014-04-24 MED ORDER — HYDROCODONE-ACETAMINOPHEN 5-325 MG PO TABS
1.0000 | ORAL_TABLET | Freq: Once | ORAL | Status: AC
  Administered 2014-04-24: 1 via ORAL
  Filled 2014-04-24: qty 1

## 2014-04-24 MED ORDER — ONDANSETRON 8 MG PO TBDP
8.0000 mg | ORAL_TABLET | Freq: Once | ORAL | Status: AC
  Administered 2014-04-24: 8 mg via ORAL
  Filled 2014-04-24: qty 1

## 2014-04-24 MED ORDER — DIPHENHYDRAMINE HCL 50 MG/ML IJ SOLN
50.0000 mg | Freq: Once | INTRAMUSCULAR | Status: AC
  Administered 2014-04-24: 50 mg via INTRAMUSCULAR
  Filled 2014-04-24: qty 1

## 2014-04-24 MED ORDER — HYDROCODONE-ACETAMINOPHEN 5-325 MG PO TABS: 1.0000 | ORAL_TABLET | ORAL | Status: DC | PRN

## 2014-04-24 MED ORDER — DEXAMETHASONE SODIUM PHOSPHATE 10 MG/ML IJ SOLN
10.0000 mg | Freq: Once | INTRAMUSCULAR | Status: AC
  Administered 2014-04-24: 10 mg via INTRAMUSCULAR
  Filled 2014-04-24: qty 1

## 2014-04-24 NOTE — Discharge Instructions (Signed)
Migraine Headache A migraine headache is an intense, throbbing pain on one or both sides of your head. A migraine can last for 30 minutes to several hours. CAUSES  The exact cause of a migraine headache is not always known. However, a migraine may be caused when nerves in the brain become irritated and release chemicals that cause inflammation. This causes pain. Certain things may also trigger migraines, such as:  Alcohol.  Smoking.  Stress.  Menstruation.  Aged cheeses.  Foods or drinks that contain nitrates, glutamate, aspartame, or tyramine.  Lack of sleep.  Chocolate.  Caffeine.  Hunger.  Physical exertion.  Fatigue.  Medicines used to treat chest pain (nitroglycerine), birth control pills, estrogen, and some blood pressure medicines. SIGNS AND SYMPTOMS  Pain on one or both sides of your head.  Pulsating or throbbing pain.  Severe pain that prevents daily activities.  Pain that is aggravated by any physical activity.  Nausea, vomiting, or both.  Dizziness.  Pain with exposure to bright lights, loud noises, or activity.  General sensitivity to bright lights, loud noises, or smells. Before you get a migraine, you may get warning signs that a migraine is coming (aura). An aura may include:  Seeing flashing lights.  Seeing bright spots, halos, or zigzag lines.  Having tunnel vision or blurred vision.  Having feelings of numbness or tingling.  Having trouble talking.  Having muscle weakness. DIAGNOSIS  A migraine headache is often diagnosed based on:  Symptoms.  Physical exam.  A CT scan or MRI of your head. These imaging tests cannot diagnose migraines, but they can help rule out other causes of headaches. TREATMENT Medicines may be given for pain and nausea. Medicines can also be given to help prevent recurrent migraines.  HOME CARE INSTRUCTIONS  Only take over-the-counter or prescription medicines for pain or discomfort as directed by your  health care provider. The use of long-term narcotics is not recommended.  Lie down in a dark, quiet room when you have a migraine.  Keep a journal to find out what may trigger your migraine headaches. For example, write down:  What you eat and drink.  How much sleep you get.  Any change to your diet or medicines.  Limit alcohol consumption.  Quit smoking if you smoke.  Get 7-9 hours of sleep, or as recommended by your health care provider.  Limit stress.  Keep lights dim if bright lights bother you and make your migraines worse. SEEK IMMEDIATE MEDICAL CARE IF:   Your migraine becomes severe.  You have a fever.  You have a stiff neck.  You have vision loss.  You have muscular weakness or loss of muscle control.  You start losing your balance or have trouble walking.  You feel faint or pass out.  You have severe symptoms that are different from your first symptoms. MAKE SURE YOU:   Understand these instructions.  Will watch your condition. Nosebleed Nosebleeds can be caused by many conditions, including trauma, infections, polyps, foreign bodies, dry mucous membranes or climate, medicines, and air conditioning. Most nosebleeds occur in the front of the nose. Because of this location, most nosebleeds can be controlled by pinching the nostrils gently and continuously for at least 10 to 20 minutes. The long, continuous pressure allows enough time for the blood to clot. If pressure is released during that 10 to 20 minute time period, the process may have to be started again. The nosebleed may stop by itself or quit with pressure, or it  may need concentrated heating (cautery) or pressure from packing. HOME CARE INSTRUCTIONS  If your nose was packed, try to maintain the pack inside until your health care provider removes it. If a gauze pack was used and it starts to fall out, gently replace it or cut the end off. Do not cut if a balloon catheter was used to pack the nose.  Otherwise, do not remove unless instructed. Avoid blowing your nose for 12 hours after treatment. This could dislodge the pack or clot and start the bleeding again. If the bleeding starts again, sit up and bend forward, gently pinching the front half of your nose continuously for 20 minutes. If bleeding was caused by dry mucous membranes, use over-the-counter saline nasal spray or gel. This will keep the mucous membranes moist and allow them to heal. If you must use a lubricant, choose the water-soluble variety. Use it only sparingly and not within several hours of lying down. Do not use petroleum jelly or mineral oil, as these may drip into the lungs and cause serious problems. Maintain humidity in your home by using less air conditioning or by using a humidifier. Do not use aspirin or medicines which make bleeding more likely. Your health care provider can give you recommendations on this. Resume normal activities as you are able, but try to avoid straining, lifting, or bending at the waist for several days. If the nosebleeds become recurrent and the cause is unknown, your health care provider may suggest laboratory tests. SEEK MEDICAL CARE IF: You have a fever. SEEK IMMEDIATE MEDICAL CARE IF:  Bleeding recurs and cannot be controlled. There is unusual bleeding from or bruising on other parts of the body. Nosebleeds continue. There is any worsening of the condition which originally brought you in. You become light-headed, feel faint, become sweaty, or vomit blood. MAKE SURE YOU:  Understand these instructions. Will watch your condition. Will get help right away if you are not doing well or get worse. Document Released: 05/26/2005 Document Revised: 12/31/2013 Document Reviewed: 07/17/2009 [facility] Patient Information 2015 [place], [place]. This information is not intended to replace advice given to you by your health care provider. Make sure you discuss any questions you have with your health  care provider.   Will get help right away if you are not doing well or get worse. Document Released: 08/16/2005 Document Revised: 12/31/2013 Document Reviewed: 04/23/2013 [facility] Patient Information 2015 [place], [place]. This information is not intended to replace advice given to you by your health care provider. Make sure you discuss any questions you have with your health care provider.   You may take the hydrocodone prescribed for pain relief if your headache persists.  This will make you drowsy - do not drive within 4 hours of taking this medication.

## 2014-04-24 NOTE — ED Notes (Signed)
PT c/o nosebleed with large amount of blood x1 hr. Bleeding controlled at this time. PT c/o headache and nausea.

## 2014-04-24 NOTE — Care Management Note (Signed)
ED/CM noted patient did not have health insurance and/or PCP listed in the computer.  Patient was given the [place] resource handout with information on the clinics, food pantries, and the handout for new health insurance sign-up.  Patient expressed appreciation for information received. Pt was also given a Rx assistance card. 

## 2014-04-26 NOTE — ED Provider Notes (Signed)
CSN: [id number]     Arrival date & time 04/24/14  1147 History   First MD Initiated Contact with Patient 04/24/14 1333     Chief Complaint  Patient presents with  . Epistaxis     (Consider location/radiation/quality/duration/timing/severity/associated sxs/prior Treatment) The history is provided by the patient and the spouse.   [name] Koch is a 33 y.o. female presenting for evaluation of epistaxis and headache.  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 She had moderate post nasal drip and blew out a large blood clot since arrival.  She denies nasal trauma.  While she was treating her nosebleed she developed a headache which has persisted.  It is across her forehead with a constant ache along with nausea.  She denies fevers or chills,  vomiting, dizziness, visual changes, photophobia or focal weakness. She has taken no medicines prior to arrival.     Past Medical History  Diagnosis Date  . Kidney stone   . Kidney stone    Past Surgical History  Procedure Laterality Date  . Tubal ligation    . Tubal ligation    . Breast surgery    . Cystoscopy with stent placement Left 09/06/2013    Procedure: CYSTOSCOPY WITH STENT PLACEMENT;  Surgeon: Ky Barban, MD;  Location: [facility];  Service: Urology;  Laterality: Left;   No family history on file. History  Substance Use Topics  . Smoking status: Never Smoker   . Smokeless tobacco: Not on file  . Alcohol Use: No   OB History   Grav Para Term Preterm Abortions TAB SAB Ect Mult Living                 Review of Systems  Constitutional: Negative for fever and chills.  HENT: Positive for nosebleeds. Negative for congestion and sore throat.   Eyes: Negative.   Respiratory: Negative for chest tightness and shortness of breath.   Cardiovascular: Negative for chest pain.  Gastrointestinal: Positive for nausea.  Negative for abdominal pain.  Genitourinary: Negative.   Musculoskeletal: Negative for arthralgias, joint swelling and neck pain.  Skin: Negative.  Negative for rash and wound.  Neurological: Positive for headaches. Negative for dizziness, weakness, light-headedness and numbness.  Hematological: Does not bruise/bleed easily.  Psychiatric/Behavioral: Negative.       Allergies  Review of patient's allergies indicates no known allergies.  Home Medications   Prior to Admission medications   Medication Sig Start Date End Date Taking? Authorizing Provider  HYDROcodone-acetaminophen (NORCO/VICODIN) 5-325 MG per tablet Take 1 tablet by mouth every 4 (four) hours as needed. 04/24/14   Burgess Amor, PA-C   BP 112/56  Pulse 82  Temp(Src) 98.3 F (36.8 C) (Oral)  Resp 16  Ht  (1.651 m)  Wt 150 lb (68.04 kg)  BMI 24.96 kg/m2  SpO2 100%  LMP 04/17/2014 Physical Exam  Nursing note and vitals reviewed. Constitutional: She is oriented to person, place, and time. She appears well-developed and well-nourished.  HENT:  Head: Normocephalic and atraumatic.  Right Ear: Tympanic membrane, external ear and ear canal normal.  Left Ear: Tympanic membrane, external ear and ear canal normal.  Nose: No mucosal edema or rhinorrhea.  Mouth/Throat: Oropharynx is clear and moist.  Traces of dried blood in left nostril.  Small blood vessel mid septum possible source of bleed, but currently resolved.  Eyes: EOM are  normal. Pupils are equal, round, and reactive to light.  Neck: Normal range of motion. Neck supple.  Cardiovascular: Normal rate and normal heart sounds.   Pulmonary/Chest: Effort normal.  Abdominal: Soft. There is no tenderness. There is no rebound and no guarding.  Musculoskeletal: Normal range of motion.  Lymphadenopathy:    She has no cervical adenopathy.  Neurological: She is alert and oriented to person, place, and time. She has normal strength. No sensory deficit. Gait normal. GCS eye  subscore is 4. GCS verbal subscore is 5. GCS motor subscore is 6.  Normal heel-shin, normal rapid alternating movements. Cranial nerves III-XII intact.  No pronator drift.  Skin: Skin is warm and dry. No rash noted.  Psychiatric: She has a normal mood and affect. Her speech is normal and behavior is normal. Thought content normal. Cognition and memory are normal.    ED Course  Procedures (including critical care time) Labs Review Labs Reviewed - No data to display  Imaging Review No results found.   EKG Interpretation None      MDM   Final diagnoses:  Anterior epistaxis  Migraine without aura and without status migrainosus, not intractable    Advised to avoid blowing nose for several days.instructions given in event nosebleed returns.  Dexamethasone, benadryl given IM, zofran odt with improvement in headache sx.  No neuro deficits on exam.  Suspect headache result of anxiety associated with nosebleed.  Prn f/u advised. Blood pressure stable.    Burgess Amor, PA-C 04/26/14 2332

## 2014-04-28 NOTE — ED Provider Notes (Signed)
Medical screening examination/treatment/procedure(s) were performed by non-physician practitioner and as supervising physician I was immediately available for consultation/collaboration.    Geneve Kimpel, MD 04/28/14 0722 

## 2014-07-22 ENCOUNTER — Emergency Department (HOSPITAL_COMMUNITY)
Admission: EM | Admit: 2014-07-22 | Discharge: 2014-07-22 | Disposition: A | Discharge status: Home / Self Care | Payer: Self-pay | Attending: Emergency Medicine | Admitting: Emergency Medicine

## 2014-07-22 ENCOUNTER — Encounter (HOSPITAL_COMMUNITY): Payer: Self-pay | Admitting: Emergency Medicine

## 2014-07-22 DIAGNOSIS — G43009 Migraine without aura, not intractable, without status migrainosus: Secondary | ICD-10-CM

## 2014-07-22 DIAGNOSIS — G43909 Migraine, unspecified, not intractable, without status migrainosus: Secondary | ICD-10-CM | POA: Insufficient documentation

## 2014-07-22 DIAGNOSIS — Z87442 Personal history of urinary calculi: Secondary | ICD-10-CM | POA: Insufficient documentation

## 2014-07-22 HISTORY — DX: Migraine, unspecified, not intractable, without status migrainosus: G43.909

## 2014-07-22 MED ORDER — ONDANSETRON HCL 4 MG/2ML IJ SOLN
4.0000 mg | Freq: Once | INTRAMUSCULAR | Status: AC
  Administered 2014-07-22: 4 mg via INTRAVENOUS
  Filled 2014-07-22: qty 2

## 2014-07-22 MED ORDER — DIPHENHYDRAMINE HCL 50 MG/ML IJ SOLN
25.0000 mg | Freq: Once | INTRAMUSCULAR | Status: AC
  Administered 2014-07-22: 25 mg via INTRAVENOUS
  Filled 2014-07-22: qty 1

## 2014-07-22 MED ORDER — MORPHINE SULFATE 4 MG/ML IJ SOLN
4.0000 mg | Freq: Once | INTRAMUSCULAR | Status: AC
  Administered 2014-07-22: 4 mg via INTRAVENOUS
  Filled 2014-07-22: qty 1

## 2014-07-22 MED ORDER — KETOROLAC TROMETHAMINE 30 MG/ML IJ SOLN
30.0000 mg | Freq: Once | INTRAMUSCULAR | Status: AC
  Administered 2014-07-22: 30 mg via INTRAVENOUS
  Filled 2014-07-22: qty 1

## 2014-07-22 MED ORDER — METOCLOPRAMIDE HCL 5 MG/ML IJ SOLN
10.0000 mg | Freq: Once | INTRAMUSCULAR | Status: AC
  Administered 2014-07-22: 10 mg via INTRAVENOUS
  Filled 2014-07-22: qty 2

## 2014-07-22 MED ORDER — SODIUM CHLORIDE 0.9 % IV BOLUS (SEPSIS)
1000.0000 mL | Freq: Once | INTRAVENOUS | Status: AC
  Administered 2014-07-22: 1000 mL via INTRAVENOUS

## 2014-07-22 NOTE — Care Management Note (Signed)
ED/CM noted patient did not have health insurance and/or PCP listed in the computer.  Patient was given the [place] resource handout with information on the clinics, food pantries, and the handout for new health insurance sign-up. Pt was also given a Rx discount card. Patient expressed appreciation for information received. 

## 2014-07-22 NOTE — Discharge Instructions (Signed)
Increase fluids. Rest. Return if worse.

## 2014-07-22 NOTE — ED Provider Notes (Signed)
CSN: [id number]     Arrival date & time 07/22/14  0715 History  This chart was scribed for Donnetta HutchingBrian Lucilla Petrenko, MD by Tonye RoyaltyJoshua Chen, ED Scribe. This patient was seen in room [facility] and the patient's care was started at 7:53 AM.    Chief Complaint  Patient presents with  . Migraine   The history is provided by the patient. No language interpreter was used.    HPI Comments: [name] Hallermanda A Koch is a 33 y.o. female with history of migraines who presents to the Emergency Department complaining of right frontal headache with onset upon waking a 0300 today, when she rises for work at [employer]. She states she gets 1-2 migraines a month and has other headaches as well. She states she normally uses Ibuprofen; she states she used 800mg  this morning. She states she has come to the ED for her headaches many times in the past and states they do not resolve with migraine cocktail, but do resolve with morphine. She reports associated photophobia.   Past Medical History  Diagnosis Date  . Kidney stone   . Kidney stone   . Migraine    Past Surgical History  Procedure Laterality Date  . Tubal ligation    . Tubal ligation    . Breast surgery    . Cystoscopy with stent placement Left 09/06/2013    Procedure: CYSTOSCOPY WITH STENT PLACEMENT;  Surgeon: Ky BarbanMohammad I Javaid, MD;  Location: [facility];  Service: Urology;  Laterality: Left;   History reviewed. No pertinent family history. History  Substance Use Topics  . Smoking status: Never Smoker   . Smokeless tobacco: Not on file  . Alcohol Use: No   OB History    No data available     Review of Systems A complete 10 system review of systems was obtained and all systems are negative except as noted in the HPI and PMH.    Allergies  Review of patient's allergies indicates no known allergies.  Home Medications   Prior to Admission medications   Medication Sig Start Date End Date Taking? Authorizing Provider  diphenhydramine-acetaminophen (TYLENOL PM) 25-500 MG  TABS Take 1 tablet by mouth at bedtime as needed (SLEEP).   Yes Historical Provider, MD  ibuprofen (ADVIL,MOTRIN) 800 MG tablet Take 800 mg by mouth every 8 (eight) hours as needed for moderate pain.   Yes Historical Provider, MD  HYDROcodone-acetaminophen (NORCO/VICODIN) 5-325 MG per tablet Take 1 tablet by mouth every 4 (four) hours as needed. Patient not taking: Reported on 07/22/2014 04/24/14   Burgess AmorJulie Idol, PA-C   BP 112/58 mmHg  Pulse 87  Temp(Src) 98 F (36.7 C) (Oral)  Resp 14  Ht 5\' 4"  (1.626 m)  Wt 155 lb (70.308 kg)  BMI 26.59 kg/m2  SpO2 100%  LMP 07/08/2014 Physical Exam  Constitutional: She is oriented to person, place, and time. She appears well-developed and well-nourished.  HENT:  Head: Normocephalic and atraumatic.  Eyes: Conjunctivae and EOM are normal. Pupils are equal, round, and reactive to light.  Neck: Normal range of motion. Neck supple.  Cardiovascular: Normal rate, regular rhythm and normal heart sounds.   Pulmonary/Chest: Effort normal and breath sounds normal.  Abdominal: Soft. Bowel sounds are normal.  Musculoskeletal: Normal range of motion.  Neurological: She is alert and oriented to person, place, and time.  Skin: Skin is warm and dry.  Psychiatric: She has a normal mood and affect. Her behavior is normal.  Nursing note and vitals reviewed.   ED Course  Procedures (including critical care time)  DIAGNOSTIC STUDIES: Oxygen Saturation is 96% on room air, normal by my interpretation.    COORDINATION OF CARE: 7:58 AM Discussed treatment plan with patient at beside, including Toradol IV, Benadryl, and Reglan. The patient agrees with the plan and has no further questions at this time.   Labs Review Labs Reviewed - No data to display  Imaging Review No results found.   EKG Interpretation None      MDM   Final diagnoses:  Nonintractable migraine, unspecified migraine type    Patient feels much better after IV fluids, IV Toradol, IV  Benadryl, IV Reglan, IV morphine, IV Zofran.  No neurological deficits.    Donnetta HutchingBrian Kendyll Huettner, MD 07/23/14 [phone]

## 2014-07-22 NOTE — ED Notes (Signed)
Patient complaining of migraine since awakening at 0300 this morning. Also complaining of vomiting. Patient has history of same.

## 2014-07-24 ENCOUNTER — Emergency Department (HOSPITAL_COMMUNITY)
Admission: EM | Admit: 2014-07-24 | Discharge: 2014-07-24 | Disposition: A | Discharge status: Home / Self Care | Payer: Self-pay | Attending: Emergency Medicine | Admitting: Emergency Medicine

## 2014-07-24 ENCOUNTER — Encounter (HOSPITAL_COMMUNITY): Payer: Self-pay | Admitting: Cardiology

## 2014-07-24 DIAGNOSIS — Z87442 Personal history of urinary calculi: Secondary | ICD-10-CM | POA: Insufficient documentation

## 2014-07-24 DIAGNOSIS — K029 Dental caries, unspecified: Secondary | ICD-10-CM | POA: Insufficient documentation

## 2014-07-24 DIAGNOSIS — K047 Periapical abscess without sinus: Secondary | ICD-10-CM | POA: Insufficient documentation

## 2014-07-24 DIAGNOSIS — Z8679 Personal history of other diseases of the circulatory system: Secondary | ICD-10-CM | POA: Insufficient documentation

## 2014-07-24 MED ORDER — HYDROCODONE-ACETAMINOPHEN 5-325 MG PO TABS: 1.0000 | ORAL_TABLET | ORAL | Status: DC | PRN

## 2014-07-24 MED ORDER — AMOXICILLIN 500 MG PO CAPS: 500.0000 mg | ORAL_CAPSULE | Freq: Three times a day (TID) | ORAL | Status: DC

## 2014-07-24 NOTE — ED Notes (Signed)
Seen here Monday with a headache.   Started having swelling right jaw Tuesday.  Swelling worse now.  Jaw and ear pain.

## 2014-07-24 NOTE — Discharge Instructions (Signed)

## 2014-07-24 NOTE — ED Provider Notes (Signed)
CSN: [id number]     Arrival date & time 07/24/14  1226 History   First MD Initiated Contact with Patient 07/24/14 1246     Chief Complaint  Patient presents with  . Facial Swelling     (Consider location/radiation/quality/duration/timing/severity/associated sxs/prior Treatment) Patient is a 33 y.o. female presenting with tooth pain. The history is provided by the patient.  Dental Pain Location:  Lower Lower teeth location:  30/RL 1st molar Quality:  Throbbing Severity:  Moderate Onset quality:  Gradual Timing:  Constant Progression:  Worsening Chronicity:  New Context: abscess   Relieved by:  Nothing Worsened by:  Cold food/drink Ineffective treatments:  NSAIDs Associated symptoms: facial pain and facial swelling    [name] Koch is a 33 y.o. female who presents to the ED with facial pain and swelling that started 2 days ago. She was here 3 days ago for a migraine but did not have dental pain or facial swelling at that time. She has had dental problems and will have dental insurance in about a week.  Past Medical History  Diagnosis Date  . Kidney stone   . Kidney stone   . Migraine    Past Surgical History  Procedure Laterality Date  . Tubal ligation    . Tubal ligation    . Breast surgery    . Cystoscopy with stent placement Left 09/06/2013    Procedure: CYSTOSCOPY WITH STENT PLACEMENT;  Surgeon: Ky BarbanMohammad I Javaid, MD;  Location: [facility];  Service: Urology;  Laterality: Left;   History reviewed. No pertinent family history. History  Substance Use Topics  . Smoking status: Never Smoker   . Smokeless tobacco: Not on file  . Alcohol Use: No   OB History    No data available     Review of Systems  HENT: Positive for dental problem and facial swelling.   all other systems negative    Allergies  Review of patient's allergies indicates no known allergies.  Home Medications   Prior to Admission medications   Medication Sig Start Date End Date Taking?  Authorizing Provider  diphenhydramine-acetaminophen (TYLENOL PM) 25-500 MG TABS Take 1 tablet by mouth at bedtime as needed (SLEEP).   Yes Historical Provider, MD  ibuprofen (ADVIL,MOTRIN) 800 MG tablet Take 800 mg by mouth every 8 (eight) hours as needed for moderate pain.   Yes Historical Provider, MD  HYDROcodone-acetaminophen (NORCO/VICODIN) 5-325 MG per tablet Take 1 tablet by mouth every 4 (four) hours as needed. Patient not taking: Reported on 07/22/2014 04/24/14   Burgess AmorJulie Idol, PA-C   BP 130/75 mmHg  Pulse 98  Temp(Src) 99.2 F (37.3 C) (Oral)  Resp 18  Ht 5\' 4"  (1.626 m)  Wt 155 lb (70.308 kg)  BMI 26.59 kg/m2  SpO2 98%  LMP 07/08/2014 Physical Exam  Constitutional: She is oriented to person, place, and time. She appears well-developed and well-nourished. No distress.  HENT:  Nose: Nose normal.  Mouth/Throat: Uvula is midline.    Facial swelling right jaw, right lower first molar with decay to the gumline. Tender on exam. Swelling of the gum surrounding the tooth.   Eyes: EOM are normal.  Neck: Neck supple.  Cardiovascular: Normal rate.   Pulmonary/Chest: Effort normal.  Musculoskeletal: Normal range of motion.  Neurological: She is alert and oriented to person, place, and time. No cranial nerve deficit.  Skin: Skin is warm and dry.  Psychiatric: She has a normal mood and affect. Her behavior is normal.  Nursing note and vitals reviewed.  ED Course  Procedures   MDM  33 y.o. female with dental decay and pain and facial swelling x 2 days. Will treat for dental abscess. She will follow up with a dentist as soon as possible. Stable for discharge without fever or trismus. Discussed with the patient and all questioned fully answered. She will return if any problems arise.    Medication List    TAKE these medications        amoxicillin 500 MG capsule  Commonly known as:  AMOXIL  Take 1 capsule (500 mg total) by mouth 3 (three) times daily.      HYDROcodone-acetaminophen 5-325 MG per tablet  Commonly known as:  NORCO/VICODIN  Take 1 tablet by mouth every 4 (four) hours as needed.      ASK your doctor about these medications        diphenhydramine-acetaminophen 25-500 MG Tabs  Commonly known as:  TYLENOL PM  Take 1 tablet by mouth at bedtime as needed (SLEEP).     ibuprofen 800 MG tablet  Commonly known as:  ADVIL,MOTRIN  Take 800 mg by mouth every 8 (eight) hours as needed for moderate pain.            [address]Etheleen Valtierra [place] Ajiah Mcglinn, NP 07/24/14 1323  Raeford RazorStephen Kohut, MD 07/27/14 [phone]

## 2014-09-04 ENCOUNTER — Encounter (HOSPITAL_COMMUNITY): Payer: Self-pay | Admitting: *Deleted

## 2014-09-04 ENCOUNTER — Emergency Department (HOSPITAL_COMMUNITY)
Admission: EM | Admit: 2014-09-04 | Discharge: 2014-09-04 | Disposition: A | Discharge status: Home / Self Care | Payer: Self-pay | Attending: Emergency Medicine | Admitting: Emergency Medicine

## 2014-09-04 DIAGNOSIS — Z87442 Personal history of urinary calculi: Secondary | ICD-10-CM | POA: Insufficient documentation

## 2014-09-04 DIAGNOSIS — Z8679 Personal history of other diseases of the circulatory system: Secondary | ICD-10-CM | POA: Insufficient documentation

## 2014-09-04 DIAGNOSIS — Z792 Long term (current) use of antibiotics: Secondary | ICD-10-CM | POA: Insufficient documentation

## 2014-09-04 DIAGNOSIS — Z3202 Encounter for pregnancy test, result negative: Secondary | ICD-10-CM | POA: Insufficient documentation

## 2014-09-04 DIAGNOSIS — N12 Tubulo-interstitial nephritis, not specified as acute or chronic: Secondary | ICD-10-CM

## 2014-09-04 LAB — BASIC METABOLIC PANEL
Anion gap: 10 (ref 5–15)
BUN: 21 mg/dL (ref 6–23)
CALCIUM: 10.4 mg/dL (ref 8.4–10.5)
CO2: 27 mmol/L (ref 19–32)
CREATININE: 1.36 mg/dL — AB (ref 0.50–1.10)
Chloride: 101 mEq/L (ref 96–112)
GFR calc Af Amer: 58 mL/min — ABNORMAL LOW (ref >90)
GFR, EST NON AFRICAN AMERICAN: 50 mL/min — AB (ref >90)
Glucose, Bld: 128 mg/dL — ABNORMAL HIGH (ref 70–99)
Potassium: 3.5 mmol/L (ref 3.5–5.1)
SODIUM: 138 mmol/L (ref 135–145)

## 2014-09-04 LAB — CBC WITH DIFFERENTIAL/PLATELET
BASOS ABS: 0 10*3/uL (ref 0.0–0.1)
BASOS PCT: 0 % (ref 0–1)
EOS ABS: 0 10*3/uL (ref 0.0–0.7)
EOS PCT: 0 % (ref 0–5)
HEMATOCRIT: 43.3 % (ref 36.0–46.0)
Hemoglobin: 13.9 g/dL (ref 12.0–15.0)
LYMPHS PCT: 3 % — AB (ref 12–46)
Lymphs Abs: 0.5 10*3/uL — ABNORMAL LOW (ref 0.7–4.0)
MCH: 28.4 pg (ref 26.0–34.0)
MCHC: 32.1 g/dL (ref 30.0–36.0)
MCV: 88.4 fL (ref 78.0–100.0)
Monocytes Absolute: 1.2 10*3/uL — ABNORMAL HIGH (ref 0.1–1.0)
Monocytes Relative: 8 % (ref 3–12)
Neutro Abs: 13.3 10*3/uL — ABNORMAL HIGH (ref 1.7–7.7)
Neutrophils Relative %: 89 % — ABNORMAL HIGH (ref 43–77)
PLATELETS: 153 10*3/uL (ref 150–400)
RBC: 4.9 MIL/uL (ref 3.87–5.11)
RDW: 13 % (ref 11.5–15.5)
WBC: 15 10*3/uL — ABNORMAL HIGH (ref 4.0–10.5)

## 2014-09-04 LAB — URINALYSIS, ROUTINE W REFLEX MICROSCOPIC
Glucose, UA: NEGATIVE mg/dL
Ketones, ur: NEGATIVE mg/dL
Nitrite: NEGATIVE
PH: 6 (ref 5.0–8.0)
Protein, ur: 100 mg/dL — AB
Urobilinogen, UA: 1 mg/dL (ref 0.0–1.0)

## 2014-09-04 LAB — URINE MICROSCOPIC-ADD ON

## 2014-09-04 LAB — PREGNANCY, URINE: Preg Test, Ur: NEGATIVE

## 2014-09-04 MED ORDER — SODIUM CHLORIDE 0.9 % IV BOLUS (SEPSIS)
500.0000 mL | Freq: Once | INTRAVENOUS | Status: AC
  Administered 2014-09-04: 500 mL via INTRAVENOUS

## 2014-09-04 MED ORDER — ONDANSETRON HCL 4 MG/2ML IJ SOLN
4.0000 mg | Freq: Once | INTRAMUSCULAR | Status: AC
  Administered 2014-09-04: 4 mg via INTRAVENOUS
  Filled 2014-09-04: qty 2

## 2014-09-04 MED ORDER — CEPHALEXIN 500 MG PO CAPS: 500.0000 mg | ORAL_CAPSULE | Freq: Four times a day (QID) | ORAL | Status: DC

## 2014-09-04 MED ORDER — CEPHALEXIN 500 MG PO CAPS
500.0000 mg | ORAL_CAPSULE | Freq: Once | ORAL | Status: AC
  Administered 2014-09-04: 500 mg via ORAL
  Filled 2014-09-04: qty 1

## 2014-09-04 MED ORDER — ONDANSETRON HCL 8 MG PO TABS: 8.0000 mg | ORAL_TABLET | Freq: Three times a day (TID) | ORAL | Status: DC | PRN

## 2014-09-04 MED ORDER — FENTANYL CITRATE 0.05 MG/ML IJ SOLN
100.0000 ug | Freq: Once | INTRAMUSCULAR | Status: AC
  Administered 2014-09-04: 100 ug via INTRAVENOUS
  Filled 2014-09-04: qty 2

## 2014-09-04 NOTE — Discharge Instructions (Signed)
Drink plenty of fluids. Use ibuprofen, or acetaminophen, for pain. Gradually advance your diet. Return here, if needed, for problems.   Pyelonephritis, Adult Pyelonephritis is a kidney infection. In general, there are 2 main types of pyelonephritis:  Infections that come on quickly without any warning (acute pyelonephritis).  Infections that persist for a long period of time (chronic pyelonephritis). CAUSES  Two main causes of pyelonephritis are:  Bacteria traveling from the bladder to the kidney. This is a problem especially in pregnant women. The urine in the bladder can become filled with bacteria from multiple causes, including:  Inflammation of the prostate gland (prostatitis).  Sexual intercourse in females.  Bladder infection (cystitis).  Bacteria traveling from the bloodstream to the tissue part of the kidney. Problems that may increase your risk of getting a kidney infection include:  Diabetes.  Kidney stones or bladder stones.  Cancer.  Catheters placed in the bladder.  Other abnormalities of the kidney or ureter. SYMPTOMS   Abdominal pain.  Pain in the side or flank area.  Fever.  Chills.  Upset stomach.  Blood in the urine (dark urine).  Frequent urination.  Strong or persistent urge to urinate.  Burning or stinging when urinating. DIAGNOSIS  Your caregiver may diagnose your kidney infection based on your symptoms. A urine sample may also be taken. TREATMENT  In general, treatment depends on how severe the infection is.   If the infection is mild and caught early, your caregiver may treat you with oral antibiotics and send you home.  If the infection is more severe, the bacteria may have gotten into the bloodstream. This will require intravenous (IV) antibiotics and a hospital stay. Symptoms may include:  High fever.  Severe flank pain.  Shaking chills.  Even after a hospital stay, your caregiver may require you to be on oral  antibiotics for a period of time.  Other treatments may be required depending upon the cause of the infection. HOME CARE INSTRUCTIONS   Take your antibiotics as directed. Finish them even if you start to feel better.  Make an appointment to have your urine checked to make sure the infection is gone.  Drink enough fluids to keep your urine clear or pale yellow.  Take medicines for the bladder if you have urgency and frequency of urination as directed by your caregiver. SEEK IMMEDIATE MEDICAL CARE IF:   You have a fever or persistent symptoms for more than 2-3 days.  You have a fever and your symptoms suddenly get worse.  You are unable to take your antibiotics or fluids.  You develop shaking chills.  You experience extreme weakness or fainting.  There is no improvement after 2 days of treatment. MAKE SURE YOU:  Understand these instructions.  Will watch your condition.  Will get help right away if you are not doing well or get worse. Document Released: 08/16/2005 Document Revised: 02/15/2012 Document Reviewed: 01/20/2011 [facility] Patient Information 2015 [employer] [place]. This information is not intended to replace advice given to you by your health care provider. Make sure you discuss any questions you have with your health care provider.

## 2014-09-04 NOTE — ED Notes (Signed)
Pt states lower back pain, cloudy urine and vomiting, intermittent low grade fever also. Symptoms since Monday.

## 2014-09-04 NOTE — ED Provider Notes (Signed)
CSN: [id number]     Arrival date & time 09/04/14  1322 History  This chart was scribed for Flint Melter, MD by Ronney Lion, ED Scribe. This patient was seen in room APA07/APA07 and the patient's care was started at 3:13 PM.    Chief Complaint  Patient presents with  . Emesis   The history is provided by the patient. No language interpreter was used.     HPI Comments: JERIANNE ANSELMO is a 34 y.o. female with a history of kidney stones who presents to the Emergency Department complaining of right lower back pain that began 3 days ago. She complains of associated fatigue, low-grade fever, dehydration, and cloudy urine. Patient states her last kidney stone was in January about 1 year ago, which was passed with lithotripsy. She denies cough. Patient's LMP was 1 week ago, and she has a history of tubal ligation. She works at [employer] presently.   Past Medical History  Diagnosis Date  . Kidney stone   . Kidney stone   . Migraine    Past Surgical History  Procedure Laterality Date  . Tubal ligation    . Tubal ligation    . Breast surgery    . Cystoscopy with stent placement Left 09/06/2013    Procedure: CYSTOSCOPY WITH STENT PLACEMENT;  Surgeon: Ky Barban, MD;  Location: [facility];  Service: Urology;  Laterality: Left;   No family history on file. History  Substance Use Topics  . Smoking status: Never Smoker   . Smokeless tobacco: Not on file  . Alcohol Use: No   OB History    No data available     Review of Systems  Constitutional: Positive for fever and fatigue.  Respiratory: Negative for cough.   Musculoskeletal: Positive for back pain.  All other systems reviewed and are negative.     Allergies  Review of patient's allergies indicates no known allergies.  Home Medications   Prior to Admission medications   Medication Sig Start Date End Date Taking? Authorizing Provider  diphenhydramine-acetaminophen (TYLENOL PM) 25-500 MG TABS Take 1 tablet by mouth at bedtime as  needed (SLEEP).   Yes Historical Provider, MD  cephALEXin (KEFLEX) 500 MG capsule Take 1 capsule (500 mg total) by mouth 4 (four) times daily. 09/04/14   Flint Melter, MD  ondansetron (ZOFRAN) 8 MG tablet Take 1 tablet (8 mg total) by mouth every 8 (eight) hours as needed for nausea or vomiting. 09/04/14   Flint Melter, MD   BP 120/69 mmHg  Pulse 115  Temp(Src) 98.8 F (37.1 C) (Oral)  Resp 18  Ht  (1.651 m)  Wt 153 lb (69.4 kg)  BMI 25.46 kg/m2  SpO2 97%  LMP 08/28/2014 Physical Exam  Constitutional: She is oriented to person, place, and time. She appears well-developed and well-nourished.  HENT:  Head: Normocephalic and atraumatic.  Eyes: Conjunctivae and EOM are normal. Pupils are equal, round, and reactive to light.  Neck: Normal range of motion and phonation normal. Neck supple.  Cardiovascular: Normal rate and regular rhythm.   Pulmonary/Chest: Effort normal and breath sounds normal. She exhibits no tenderness.  Abdominal: Soft. She exhibits no distension. There is no tenderness. There is no guarding.  Genitourinary:  No CVA tenderness.   Musculoskeletal: Normal range of motion. She exhibits tenderness.  Right lumbar tenderness.  Neurological: She is alert and oriented to person, place, and time. She exhibits normal muscle tone.  Skin: Skin is warm and dry.  Psychiatric:  She has a normal mood and affect. Her behavior is normal. Judgment and thought content normal.  Nursing note and vitals reviewed.   ED Course  Procedures (including critical care time)  DIAGNOSTIC STUDIES: Oxygen Saturation is 97% on room air, normal by my interpretation.    COORDINATION OF CARE: 3:15 PM - Discussed treatment plan with pt at bedside which includes IV fluids and pt agreed to plan. She also states she would like anti-nausea and pain medication.    Medications  cephALEXin (KEFLEX) capsule 500 mg (not administered)  sodium chloride 0.9 % bolus 500 mL (0 mLs Intravenous Stopped  09/04/14 1632)  ondansetron (ZOFRAN) injection 4 mg (4 mg Intravenous Given 09/04/14 1534)  fentaNYL (SUBLIMAZE) injection 100 mcg (100 mcg Intravenous Given 09/04/14 1536)    Patient Vitals for the past 24 hrs:  BP Temp Temp src Pulse Resp SpO2 Height Weight  09/04/14 1330 120/69 mmHg 98.8 F (37.1 C) Oral 115 18 97 % 5\' 5"  (1.651 m) 153 lb (69.4 kg)   5:08 PM Reevaluation with update and discussion. After initial assessment and treatment, an updated evaluation reveals she feels better now.  She has very minimal right flank pain.  Findings discussed with patient, all questions answered. Basil Buffin L     Labs Review Labs Reviewed  URINALYSIS, ROUTINE W REFLEX MICROSCOPIC - Abnormal; Notable for the following:    APPearance HAZY (*)    Specific Gravity, Urine >1.030 (*)    Hgb urine dipstick MODERATE (*)    Bilirubin Urine MODERATE (*)    Protein, ur 100 (*)    Leukocytes, UA MODERATE (*)    All other components within normal limits  CBC WITH DIFFERENTIAL - Abnormal; Notable for the following:    WBC 15.0 (*)    Neutrophils Relative % 89 (*)    Neutro Abs 13.3 (*)    Lymphocytes Relative 3 (*)    Lymphs Abs 0.5 (*)    Monocytes Absolute 1.2 (*)    All other components within normal limits  BASIC METABOLIC PANEL - Abnormal; Notable for the following:    Glucose, Bld 128 (*)    Creatinine, Ser 1.36 (*)    GFR calc non Af Amer 50 (*)    GFR calc Af Amer 58 (*)    All other components within normal limits  URINE MICROSCOPIC-ADD ON - Abnormal; Notable for the following:    Bacteria, UA MANY (*)    All other components within normal limits  URINE CULTURE  PREGNANCY, URINE    Imaging Review No results found.   EKG Interpretation None      MDM   Final diagnoses:  Pyelonephritis   UTI with likely involvement right kidney consistent with mild pyelonephritis.  She is nontoxic with normal blood pressure.  Doubt severe sepsis, metabolic instability or impending vascular  collapse.  Nursing Notes Reviewed/ Care Coordinated Applicable Imaging Reviewed Interpretation of Laboratory Data incorporated into ED treatment  The patient appears reasonably screened and/or stabilized for discharge and I doubt any other medical condition or other [facility] requiring further screening, evaluation, or treatment in the ED at this time prior to discharge.  Plan: Home Medications- Keflex, Zofran; Home Treatments- rest, fluids; return here if the recommended treatment, does not improve the symptoms; Recommended follow up- PCP of choice 1-2 weeks for repeat urine testing   Flint MelterElliott L Jeet Shough, MD 09/04/14 1711

## 2014-09-06 LAB — URINE CULTURE: Colony Count: >=100000

## 2014-09-10 ENCOUNTER — Telehealth (HOSPITAL_COMMUNITY): Payer: Self-pay

## 2014-09-10 NOTE — Telephone Encounter (Signed)
Post ED Visit - Positive Culture Follow-up  Culture report reviewed by antimicrobial stewardship pharmacist: []  Wes Dulaney, Pharm.D., BCPS [x]  Celedonio MiyamotoJeremy Frens, Pharm.D., BCPS []  Georgina PillionElizabeth Martin, [address] BCPS []  Slippery RockMinh Pham, [address]Ph[address]., BCPS, AAHIVP []  Estella HuskMichelle Turner, Pharm.D., BCPS, AAHIVP []  Elder CyphersLorie Poole, [address]Ph[address] BCPS  Positive Urine culture, 100,000 colonies -> Ecoli Treated with Cephalexin, organism sensitive to the same and no further patient follow-up is required at this time.

## 2014-11-06 ENCOUNTER — Encounter (HOSPITAL_COMMUNITY): Payer: Self-pay

## 2014-11-06 ENCOUNTER — Emergency Department (HOSPITAL_COMMUNITY)
Admission: EM | Admit: 2014-11-06 | Discharge: 2014-11-06 | Disposition: A | Discharge status: Home / Self Care | Payer: Self-pay | Attending: Emergency Medicine | Admitting: Emergency Medicine

## 2014-11-06 DIAGNOSIS — Z8679 Personal history of other diseases of the circulatory system: Secondary | ICD-10-CM | POA: Insufficient documentation

## 2014-11-06 DIAGNOSIS — R51 Headache: Secondary | ICD-10-CM | POA: Insufficient documentation

## 2014-11-06 DIAGNOSIS — H109 Unspecified conjunctivitis: Secondary | ICD-10-CM

## 2014-11-06 DIAGNOSIS — Z87442 Personal history of urinary calculi: Secondary | ICD-10-CM | POA: Insufficient documentation

## 2014-11-06 DIAGNOSIS — Z792 Long term (current) use of antibiotics: Secondary | ICD-10-CM | POA: Insufficient documentation

## 2014-11-06 MED ORDER — MELOXICAM 7.5 MG PO TABS: ORAL_TABLET | ORAL | Status: DC

## 2014-11-06 MED ORDER — TOBRAMYCIN 0.3 % OP SOLN
2.0000 [drp] | Freq: Once | OPHTHALMIC | Status: AC
  Administered 2014-11-06: 2 [drp] via OPHTHALMIC
  Filled 2014-11-06: qty 5

## 2014-11-06 MED ORDER — KETOROLAC TROMETHAMINE 10 MG PO TABS
10.0000 mg | ORAL_TABLET | Freq: Once | ORAL | Status: AC
  Administered 2014-11-06: 10 mg via ORAL
  Filled 2014-11-06: qty 1

## 2014-11-06 MED ORDER — HYDROCODONE-ACETAMINOPHEN 5-325 MG PO TABS: 1.0000 | ORAL_TABLET | ORAL | Status: DC | PRN

## 2014-11-06 NOTE — ED Notes (Signed)
Woke up this am with eye red and draining.

## 2014-11-06 NOTE — ED Notes (Signed)
PA at bedside.

## 2014-11-06 NOTE — Discharge Instructions (Signed)
Your examination is consistent with conjunctivitis (pink eye). Please wash hands frequently. Please white down surfaces. Please keep your distance from others. Using dark glasses, and a hat with brim can be more comfortable for you. Please use my back 2 times daily with food. Please use Norco every 4 hours if needed for headache or pain. Please apply a cool compress to your eye for comfort. Please use to tobramycin eyedrops in the left eye every 4 hours for the next 5 days. Conjunctivitis Conjunctivitis is commonly called "pink eye." Conjunctivitis can be caused by bacterial or viral infection, allergies, or injuries. There is usually redness of the lining of the eye, itching, discomfort, and sometimes discharge. There may be deposits of matter along the eyelids. A viral infection usually causes a watery discharge, while a bacterial infection causes a yellowish, thick discharge. Pink eye is very contagious and spreads by direct contact. You may be given antibiotic eyedrops as part of your treatment. Before using your eye medicine, remove all drainage from the eye by washing gently with warm water and cotton balls. Continue to use the medication until you have awakened 2 mornings in a row without discharge from the eye. Do not rub your eye. This increases the irritation and helps spread infection. Use separate towels from other household members. Wash your hands with soap and water before and after touching your eyes. Use cold compresses to reduce pain and sunglasses to relieve irritation from light. Do not wear contact lenses or wear eye makeup until the infection is gone. SEEK MEDICAL CARE IF:   Your symptoms are not better after 3 days of treatment.  You have increased pain or trouble seeing.  The outer eyelids become very red or swollen. Document Released: 09/23/2004 Document Revised: 11/08/2011 Document Reviewed: 08/16/2005 [facility] Patient Information 2015 [place], [place]. This information is not  intended to replace advice given to you by your health care provider. Make sure you discuss any questions you have with your health care provider.

## 2014-11-06 NOTE — ED Provider Notes (Signed)
CSN: [id number]     Arrival date & time 11/06/14  1056 History   First MD Initiated Contact with Patient 11/06/14 1237     Chief Complaint  Patient presents with  . Conjunctivitis     (Consider location/radiation/quality/duration/timing/severity/associated sxs/prior Treatment) Patient is a 34 y.o. female presenting with conjunctivitis. The history is provided by the patient.  Conjunctivitis This is a new problem. The current episode started today. The problem occurs constantly. The problem has been unchanged. Associated symptoms include congestion and headaches. Pertinent negatives include no abdominal pain, arthralgias, chest pain, coughing or neck pain. Associated symptoms comments: photophobia. Exacerbated by: light. She has tried nothing for the symptoms. The treatment provided no relief.    Past Medical History  Diagnosis Date  . Kidney stone   . Kidney stone   . Migraine    Past Surgical History  Procedure Laterality Date  . Tubal ligation    . Tubal ligation    . Breast surgery    . Cystoscopy with stent placement Left 09/06/2013    Procedure: CYSTOSCOPY WITH STENT PLACEMENT;  Surgeon: Ky BarbanMohammad I Javaid, MD;  Location: [facility];  Service: Urology;  Laterality: Left;   No family history on file. History  Substance Use Topics  . Smoking status: Never Smoker   . Smokeless tobacco: Not on file  . Alcohol Use: No   OB History    No data available     Review of Systems  Constitutional: Negative for activity change.       All ROS Neg except as noted in HPI  HENT: Positive for congestion. Negative for nosebleeds.   Eyes: Positive for photophobia, pain and redness. Negative for discharge.  Respiratory: Negative for cough, shortness of breath and wheezing.   Cardiovascular: Negative for chest pain and palpitations.  Gastrointestinal: Negative for abdominal pain and blood in stool.  Genitourinary: Negative for dysuria, frequency and hematuria.  Musculoskeletal: Negative for  back pain, arthralgias and neck pain.  Skin: Negative.   Neurological: Positive for headaches. Negative for dizziness, seizures and speech difficulty.  Psychiatric/Behavioral: Negative for hallucinations and confusion.      Allergies  Review of patient's allergies indicates no known allergies.  Home Medications   Prior to Admission medications   Medication Sig Start Date End Date Taking? Authorizing Provider  cephALEXin (KEFLEX) 500 MG capsule Take 1 capsule (500 mg total) by mouth 4 (four) times daily. Patient not taking: Reported on 11/06/2014 09/04/14   Mancel BaleElliott Wentz, MD  ondansetron (ZOFRAN) 8 MG tablet Take 1 tablet (8 mg total) by mouth every 8 (eight) hours as needed for nausea or vomiting. Patient not taking: Reported on 11/06/2014 09/04/14   Mancel BaleElliott Wentz, MD   BP 124/65 mmHg  Pulse 67  Temp(Src) 98.5 F (36.9 C) (Oral)  Resp 18  Ht 5\' 5"  (1.651 m)  Wt 155 lb (70.308 kg)  BMI 25.79 kg/m2  SpO2 97%  LMP 11/06/2014 Physical Exam  Constitutional: She is oriented to person, place, and time. She appears well-developed and well-nourished.  Non-toxic appearance.  HENT:  Head: Normocephalic.  Right Ear: Tympanic membrane and external ear normal.  Left Ear: Tympanic membrane and external ear normal.  Eyes: EOM are normal. Pupils are equal, round, and reactive to light. Right eye exhibits no chemosis, no discharge and no hordeolum. No foreign body present in the right eye. Left eye exhibits discharge. Left eye exhibits no chemosis and no hordeolum. No foreign body present in the left eye. Left conjunctiva is injected.  No scleral icterus. Right eye exhibits normal extraocular motion. Left eye exhibits normal extraocular motion.  Neck: Normal range of motion. Neck supple. Carotid bruit is not present.  Cardiovascular: Normal rate, regular rhythm, normal heart sounds, intact distal pulses and normal pulses.   Pulmonary/Chest: Breath sounds normal. No respiratory distress.  Abdominal:  Soft. Bowel sounds are normal. There is no tenderness. There is no guarding.  Musculoskeletal: Normal range of motion.  Lymphadenopathy:       Head (right side): No submandibular adenopathy present.       Head (left side): No submandibular adenopathy present.    She has no cervical adenopathy.  Neurological: She is alert and oriented to person, place, and time. She has normal strength. No cranial nerve deficit or sensory deficit.  Skin: Skin is warm and dry.  Psychiatric: She has a normal mood and affect. Her speech is normal.  Nursing note and vitals reviewed.   ED Course  Procedures (including critical care time) Labs Review Labs Reviewed - No data to display  Imaging Review No results found.   EKG Interpretation None      MDM  Examination is consistent with conjunctivitis of the left eye. Have advised patient to use dark glasses and a hat with brim. The patient has been given tobramycin eyedrops. Prescription for low back and forearm Norco given to the patient. The patient is to see the eye specialist if not improving.    Final diagnoses:  None    *I have reviewed nursing notes, vital signs, and all appropriate lab and imaging results for this patient.**    Ivery Quale, PA-C 11/08/14 2027  Bethann Berkshire, MD 11/12/14 [phone]

## 2015-02-12 ENCOUNTER — Emergency Department (HOSPITAL_COMMUNITY)
Admission: EM | Admit: 2015-02-12 | Discharge: 2015-02-12 | Disposition: A | Discharge status: Home / Self Care | Payer: Self-pay | Attending: Emergency Medicine | Admitting: Emergency Medicine

## 2015-02-12 ENCOUNTER — Encounter (HOSPITAL_COMMUNITY): Payer: Self-pay | Admitting: *Deleted

## 2015-02-12 DIAGNOSIS — Z87442 Personal history of urinary calculi: Secondary | ICD-10-CM | POA: Insufficient documentation

## 2015-02-12 DIAGNOSIS — K0889 Other specified disorders of teeth and supporting structures: Secondary | ICD-10-CM

## 2015-02-12 DIAGNOSIS — Z792 Long term (current) use of antibiotics: Secondary | ICD-10-CM | POA: Insufficient documentation

## 2015-02-12 DIAGNOSIS — K029 Dental caries, unspecified: Secondary | ICD-10-CM | POA: Insufficient documentation

## 2015-02-12 DIAGNOSIS — Z8679 Personal history of other diseases of the circulatory system: Secondary | ICD-10-CM | POA: Insufficient documentation

## 2015-02-12 MED ORDER — PENICILLIN V POTASSIUM 250 MG PO TABS: 250.0000 mg | ORAL_TABLET | Freq: Four times a day (QID) | ORAL | Status: DC

## 2015-02-12 MED ORDER — NAPROXEN 250 MG PO TABS: 250.0000 mg | ORAL_TABLET | Freq: Two times a day (BID) | ORAL | Status: DC | PRN

## 2015-02-12 MED ORDER — HYDROCODONE-ACETAMINOPHEN 5-325 MG PO TABS: ORAL_TABLET | ORAL | Status: DC

## 2015-02-12 NOTE — ED Notes (Signed)
Pt comes in for left sided dental pain and swelling. Pt states her symptoms started yesterday morning and have worsened today. Pt is also having headache and left ear ache. NAD noted.

## 2015-02-12 NOTE — Discharge Instructions (Signed)
°Emergency Department Resource Guide °1) Find a Doctor and Pay Out of Pocket °Although you won't have to find out who is covered by your insurance plan, it is a good idea to ask around and get recommendations. You will then need to call the office and see if the doctor you have chosen will accept you as a new patient and what types of options they offer for patients who are self-pay. Some doctors offer discounts or will set up payment plans for their patients who do not have insurance, but you will need to ask so you aren't surprised when you get to your appointment. ° °2) Contact Your Local Health Department °Not all health departments have doctors that can see patients for sick visits, but many do, so it is worth a call to see if yours does. If you don't know where your local health department is, you can check in your phone book. The CDC also has a tool to help you locate your state's health department, and many state websites also have listings of all of their local health departments. ° °3) Find a Walk-in Clinic °If your illness is not likely to be very severe or complicated, you may want to try a walk in clinic. These are popping up all over the country in pharmacies, drugstores, and shopping centers. They're usually staffed by nurse practitioners or physician assistants that have been trained to treat common illnesses and complaints. They're usually fairly quick and inexpensive. However, if you have serious medical issues or chronic medical problems, these are probably not your best option. ° °No Primary Care Doctor: °- Call [facility] at  832-8000 - they can help you locate a primary care doctor that  accepts your insurance, provides certain services, etc. °- Physician Referral Service- 1-800-533-3463 ° °Chronic Pain Problems: °Organization         Address  Phone   Notes  °[facility]  (336) 297-2271 Patients need to be referred by their primary care doctor.  ° °Medication  Assistance: °Organization         Address  Phone   Notes  °[place] [facility] [address] °[place], [place] [address] (336) 641-8030 --Must be a resident of [place] °-- Must have NO insurance coverage whatsoever (no Medicaid/ Medicare, etc.) °-- The pt. MUST have a primary care doctor that directs their care regularly and follows them in the community °  °[facility]  (866) 331-1348   °[employer]  (888) 892-1162   ° °Agencies that provide inexpensive medical care: °Organization         Address  Phone   Notes  °[facility]  (336) 832-8035   °[facility]    (336) 832-7272   °[facility] [address] °[place], [place] [address] (336) 832-4777   °[facility] [address], °[place] (336) 271-4999   °[facility]    (336) 373-0678   °[facility]    (336) 272-1050   °[facility] ° [address], [place] Phone:  (336) 832-4444, Fax:  (336) 832-4440 Hours of Operation:  9 am - 6 pm, M-F.  Also accepts Medicaid/Medicare and self-pay.  °[facility] ° [address], [place] Phone: (336) 832-3150, Fax: (336) [phone]. Hours of Operation:  8:30 am - 5:30 pm, M-F.  Also accepts Medicaid and self-pay.  °[facility] [place] [address], [place] Phone: (336) 878-6027   °[facility] [address], [place], [place] (336)723-1848, Ext. 123 Mondays & Thursdays: 7-9 AM.  First 15 patients are seen on a first come, first serve basis. °  ° °Medicaid-accepting [place] Providers: ° °Organization         Address  Phone   Notes  °[facility] [address], [place] (336) 641-2100 Also accepts self-pay patients.  °[facility] [address], [place] ° (336) 856-9996   °[facility] [address], [place]  (336) 288-8857   °[facility] [address] [place] [address], [place] (336) 299-7000   °Veita Bland [address], [place]  ° (336) 373-1557 Only accepts [place] Access Medicaid patients after they have their name applied to their card.  ° °Self-Pay (no insurance) in [place]: ° °Organization         Address  Phone   Notes  °Sickle Cell Patients, [facility] [address], [place] (336) 832-1970   °[facility] [address], [place] (336) 832-4400   °[facility] ° [address], [place] (336) 992-4800   °[facility]/Dr. Osei-Bonsu ° [address] [place] [address], [place] or [address], [place] (336) [phone] Phone number for both [place] and [place] locations is the same.  °[facility] [address], [place] (336) 299-0000   °[facility] [address] [place] [address], [place] or [address] (336) 852-7530 °(336) 878-2260   °[facility] [address], [place] (336) 350-1642, phone; (336) 294-5005, fax Sees patients 1st and 3rd Saturday of every month.  Must not qualify for public or private insurance (i.e. Medicaid, Medicare, [place] Health Choice, Veterans' Benefits) • Household income should be no more than 200% of the poverty level •The clinic cannot treat you if you are pregnant or think you are pregnant • Sexually transmitted diseases are not treated at the clinic.  ° ° °Dental Care: °Organization         Address  Phone  Notes  °[place] [facility] [address], [place] (336) 641-6152 Accepts children up to age 21 who are enrolled in Medicaid or [place] Health Choice; pregnant women with a Medicaid card; and children who have applied for Medicaid or [place] Health Choice, but were declined, whose parents can pay a reduced fee at time of service.  °[place]  [facility] [place]  [address], [place] (336) 641-7733 Accepts children up to age 21 who are enrolled in Medicaid or [place] Health Choice; pregnant women with a Medicaid card; and children who have applied for Medicaid or [place] Health Choice, but were declined, whose parents can pay a reduced fee at time of service.  °[facility] ° [address], [place] (336) 641-4533 Patients are seen by appointment only. Walk-ins are not accepted. [facility] will see patients 18 years of age and older. °Monday - Tuesday (8am-5pm) °Most Wednesdays (8:30-5pm) °$30 per visit, cash only  °[facility] ° [address], [place] (336) 641-4533 Patients are seen by appointment only. Walk-ins are not accepted. [facility] will see patients 18 years of age and older. °One   Wednesday Evening (Monthly: Volunteer Based).  $30 per visit, cash only  °[facility]  (919) 537-3737 for adults; Children under age 4, call Graduate Pediatric Dentistry at ([phone]) [phone]. Children aged 4-14, please call (919) [phone] to request a pediatric application. ° Dental services are provided in all areas of dental care including fillings, crowns and bridges, complete and partial dentures, implants, gum treatment, root canals, and extractions. Preventive care is also provided. Treatment is provided to both adults and children. °Patients are selected via a lottery and there is often a waiting list. °  °[facility] [address], °[place] ° (336) 763-8833 [email or web address].[email or web address] °  °[facility] [address], [place], [place] (336)723-1848, Ext. 123 Second and Fourth Thursday of each month, opens at 6:30 AM; Clinic ends at 9 AM.  Patients are seen on a first-come first-served basis, and a limited number are seen during each clinic.  ° °[facility] ° [address], [place], [place] (336) 723-7904    Eligibility Requirements °You must have lived in [place], or [place] counties for at least the last three months. °  You cannot be eligible for state or federal sponsored healthcare insurance, including Veterans Administration, Medicaid, or Medicare. °  You generally cannot be eligible for healthcare insurance through your employer.  °  How to apply: °Eligibility screenings are held every Tuesday and Wednesday afternoon from 1:00 pm until 4:00 pm. You do not need an appointment for the interview!  °[facility] [address], [place], [place] 336-631-2330   °[facility]  336-342-8273   °[facility]  336-703-3100   °[facility]  336-570-6415   ° °Behavioral Health Resources in the Community: °Intensive Outpatient Programs °Organization         Address  Phone  Notes  °[facility] [address], [place], [place] 336-878-6098   °[facility] [address], [place], [place] 336-832-9800   °[facility] [address], [place], [place] ° 336-882-2125   °[facility] [address],  °[place], [place] 1-800-853-5163 or 336-641-4981   °Substance Abuse Resources °Organization         Address  Phone  Notes  °[facility]  336-882-2125   °[facility]  336-784-9470   °[facility]  336-285-9073   °[facility]  336-845-3988   °Residential & Outpatient Substance Abuse Program  1-800-659-3381   °Psychological Services °Organization         Address  Phone  Notes  °[facility]  336- 832-9600   °[facility]  336- 378-7881   °[facility] [address], [place] 1-800-853-5163 or 336-641-4981   ° °Mobile Crisis Teams °Organization         Address  Phone  Notes  °[facility], Mobile Crisis Care Unit  1-877-626-1772   °Assertive °[facility] ° [address]  [place], [place] 336-834-9664   °Sharon DeEsch [address] °[place] 336-554-5454   ° °Self-Help/Support Groups °Organization         Address  Phone             Notes  °[facility]  - variety of support groups  336- 373-1402 Call for more information  °Narcotics Anonymous (NA), [facility] [address], °[place]  2 meetings at this location  ° °  Residential Treatment Programs Organization         Address  Phone  Notes  [facility] [address]    [place]  1-324-401-0272   [facility]  [address], [place] [address], [place], [place] [phone]   [facility] [address] [place], [place] [phone] Admissions: 8am-3pm M-F  [facility] [address],    [place], [place] 518-841-6606   [facility] [address] [place], [place], [place] 301-601-0932   [facility] [address]  [place], [place] 355-732-2025   [facility] Intensive Outpatient [address], [place], [place] 427-062-3762   [facility] [address] [place]  [place], [place] 8-315-176-1607 or [phone]   [facility] [address], [place], [place] 546-270-3500 Accepts Medicaid  [facility] [address]  [place] 9-381-829-9371 Substance Abuse/Addiction Treatment   [facility]         Address  Phone  Notes  [facility]  [phone]   Angie Fava, PhD [address] [place], [place]   302-816-6508 or 870-145-3013   [facility]   [address] [place], [place] 312-734-0135   [facility] [address], [place], [place] (603)[phone] Insurance/Medicaid/sponsorship through [facility] [address]                                    [place], [place] [phone] Therapy/tele-psych/case    [facility] [address], [place] (831)[phone]    Dr. Lolly Mustache  [phone]   [facility]  [employer] [facility] Dept. 1) [address] [place] 2) [address] [place] 3)  [address], [place] [phone]  [phone]   [facility] Child Abuse Hotline 386 298 2312 or (309)[phone] (After Hours)      Take the prescriptions as directed.  Call your regular dentist today to schedule a follow up appointment within the next week.  Return to the Emergency Department immediately sooner if worsening.

## 2015-02-12 NOTE — ED Provider Notes (Signed)
CSN: [id number]     Arrival date & time 02/12/15  0740 History   First MD Initiated Contact with Patient 02/12/15 0755     Chief Complaint  Patient presents with  . Dental Pain  . Oral Abscess       HPI Pt was seen at 0805. Per pt, c/o gradual onset and persistence of constant left lower tooth "pain" for the past 2 days.  Denies fevers, no intra-oral edema, no rash, no facial swelling, no dysphagia, no neck pain.  The condition is aggravated by nothing. The condition is relieved by nothing. The symptoms have been associated with no other complaints. The patient has no significant history of serious medical conditions.     Past Medical History  Diagnosis Date  . Kidney stone   . Kidney stone   . Migraine    Past Surgical History  Procedure Laterality Date  . Tubal ligation    . Tubal ligation    . Breast surgery    . Cystoscopy with stent placement Left 09/06/2013    Procedure: CYSTOSCOPY WITH STENT PLACEMENT;  Surgeon: Ky Barban, MD;  Location: [facility];  Service: Urology;  Laterality: Left;    History  Substance Use Topics  . Smoking status: Never Smoker   . Smokeless tobacco: Not on file  . Alcohol Use: No    Review of Systems ROS: Statement: All systems negative except as marked or noted in the HPI; Constitutional: Negative for fever and chills. ; ; Eyes: Negative for eye pain and discharge. ; ; ENMT: Positive for dental caries, dental hygiene poor and toothache. Negative for ear pain, bleeding gums, dental injury, facial deformity, facial swelling, hoarseness, nasal congestion, sinus pressure, sore throat, throat swelling and tongue swollen. ; ; Cardiovascular: Negative for chest pain, palpitations, diaphoresis, dyspnea and peripheral edema. ; ; Respiratory: Negative for cough, wheezing and stridor. ; ; Gastrointestinal: Negative for nausea, vomiting, diarrhea and abdominal pain. ; ; Genitourinary: Negative for dysuria, flank pain and hematuria. ; ; Musculoskeletal:  Negative for back pain and neck pain. ; ; Skin: Negative for rash and skin lesion. ; ; Neuro: Negative for headache, lightheadedness and neck stiffness.      Allergies  Review of patient's allergies indicates no known allergies.  Home Medications   Prior to Admission medications   Medication Sig Start Date End Date Taking? Authorizing Provider  cephALEXin (KEFLEX) 500 MG capsule Take 1 capsule (500 mg total) by mouth 4 (four) times daily. Patient not taking: Reported on 11/06/2014 09/04/14   Mancel Bale, MD  HYDROcodone-acetaminophen (NORCO/VICODIN) 5-325 MG per tablet Take 1 tablet by mouth every 4 (four) hours as needed. 11/06/14   Ivery Quale, PA-C  meloxicam (MOBIC) 7.5 MG tablet 1 po bid with food 11/06/14   Ivery Quale, PA-C  ondansetron (ZOFRAN) 8 MG tablet Take 1 tablet (8 mg total) by mouth every 8 (eight) hours as needed for nausea or vomiting. Patient not taking: Reported on 11/06/2014 09/04/14   Mancel Bale, MD   BP 115/62 mmHg  Pulse 85  Temp(Src) 98.7 F (37.1 C) (Oral)  Resp 16  Ht 5\' 4"  (1.626 m)  Wt 160 lb (72.576 kg)  BMI 27.45 kg/m2  SpO2 98%  LMP 01/29/2015 Physical Exam  0810: Physical examination: Vital signs and O2 SAT: Reviewed; Constitutional: Well developed, Well nourished, Well hydrated, In no acute distress; Head and Face: Normocephalic, Atraumatic; Eyes: EOMI, PERRL, No scleral icterus; ENMT: Mouth and pharynx normal, Poor dentition, Widespread dental decay, Left  TM normal, Right TM normal, Mucous membranes moist, +lower left 1st and 2nd premolars and molars with extensive dental decay.  No gingival erythema, edema, fluctuance, or drainage.  No intra-oral edema. No submandibular or sublingual edema. No hoarse voice, no drooling, no stridor. No trismus. ; Neck: Supple, Full range of motion, No lymphadenopathy; Cardiovascular: Regular rate and rhythm, No murmur, rub, or gallop; Respiratory: Breath sounds clear & equal bilaterally, No rales, rhonchi, wheezes,  Normal respiratory effort/excursion; Chest: Nontender, Movement normal; Extremities: Pulses normal, No tenderness, No edema; Neuro: AA&Ox3, Major CN grossly intact.  No gross focal motor or sensory deficits in extremities. Climbs on and off stretcher easily by herself. Gait steady.; Skin: Color normal, No rash, No petechiae, Warm, Dry   ED Course  Procedures     EKG Interpretation None      MDM  MDM Reviewed: previous chart, nursing note and vitals      0815:  Pt encouraged to f/u with dentist or oral surgeon for her dental needs for good continuity of care and definitive treatment.  Verb understanding.    Samuel Jester, DO 02/13/15 1540

## 2015-03-22 ENCOUNTER — Encounter (HOSPITAL_COMMUNITY): Payer: Self-pay | Admitting: *Deleted

## 2015-03-22 ENCOUNTER — Emergency Department (HOSPITAL_COMMUNITY)
Admission: EM | Admit: 2015-03-22 | Discharge: 2015-03-22 | Disposition: A | Discharge status: Home / Self Care | Payer: Self-pay | Attending: Emergency Medicine | Admitting: Emergency Medicine

## 2015-03-22 DIAGNOSIS — N390 Urinary tract infection, site not specified: Secondary | ICD-10-CM | POA: Insufficient documentation

## 2015-03-22 DIAGNOSIS — M792 Neuralgia and neuritis, unspecified: Secondary | ICD-10-CM | POA: Insufficient documentation

## 2015-03-22 DIAGNOSIS — Z3202 Encounter for pregnancy test, result negative: Secondary | ICD-10-CM | POA: Insufficient documentation

## 2015-03-22 DIAGNOSIS — Z9851 Tubal ligation status: Secondary | ICD-10-CM | POA: Insufficient documentation

## 2015-03-22 DIAGNOSIS — Z8679 Personal history of other diseases of the circulatory system: Secondary | ICD-10-CM | POA: Insufficient documentation

## 2015-03-22 LAB — URINALYSIS, ROUTINE W REFLEX MICROSCOPIC
Bilirubin Urine: NEGATIVE
GLUCOSE, UA: NEGATIVE mg/dL
Ketones, ur: NEGATIVE mg/dL
Nitrite: NEGATIVE
Protein, ur: NEGATIVE mg/dL
Urobilinogen, UA: 0.2 mg/dL (ref 0.0–1.0)
pH: 6 (ref 5.0–8.0)

## 2015-03-22 LAB — POC URINE PREG, ED: Preg Test, Ur: NEGATIVE

## 2015-03-22 LAB — URINE MICROSCOPIC-ADD ON

## 2015-03-22 LAB — PREGNANCY, URINE: PREG TEST UR: NEGATIVE

## 2015-03-22 MED ORDER — OXYCODONE-ACETAMINOPHEN 5-325 MG PO TABS
1.0000 | ORAL_TABLET | Freq: Once | ORAL | Status: AC
  Administered 2015-03-22: 1 via ORAL
  Filled 2015-03-22: qty 1

## 2015-03-22 MED ORDER — ONDANSETRON 8 MG PO TBDP
8.0000 mg | ORAL_TABLET | Freq: Once | ORAL | Status: AC
  Administered 2015-03-22: 8 mg via ORAL
  Filled 2015-03-22: qty 1

## 2015-03-22 MED ORDER — CEPHALEXIN 500 MG PO CAPS: 500.0000 mg | ORAL_CAPSULE | Freq: Four times a day (QID) | ORAL | Status: DC

## 2015-03-22 MED ORDER — KETOROLAC TROMETHAMINE 60 MG/2ML IM SOLN
60.0000 mg | Freq: Once | INTRAMUSCULAR | Status: AC
  Administered 2015-03-22: 60 mg via INTRAMUSCULAR
  Filled 2015-03-22: qty 2

## 2015-03-22 MED ORDER — OXYCODONE-ACETAMINOPHEN 5-325 MG PO TABS: 1.0000 | ORAL_TABLET | ORAL | Status: DC | PRN

## 2015-03-22 MED ORDER — CEPHALEXIN 500 MG PO CAPS
500.0000 mg | ORAL_CAPSULE | Freq: Once | ORAL | Status: AC
  Administered 2015-03-22: 500 mg via ORAL
  Filled 2015-03-22: qty 1

## 2015-03-22 NOTE — ED Notes (Signed)
POC urine preg negative 

## 2015-03-22 NOTE — ED Notes (Signed)
L flank pain started last night and has progressively worsened since.  Denies dysuria, burning or stinging, but states she has difficulty urinating.

## 2015-03-22 NOTE — Discharge Instructions (Signed)

## 2015-03-22 NOTE — ED Provider Notes (Signed)
CSN: [id number]     Arrival date & time 03/22/15  1452 History   First MD Initiated Contact with Patient 03/22/15 1502     Chief Complaint  Patient presents with  . Flank Pain     (Consider location/radiation/quality/duration/timing/severity/associated sxs/prior Treatment) HPI   [name] Koch is a 34 y.o. female who presents for evaluation of left low back pain which started yesterday and feels like her typical kidney stone pain. She has urinary urgency, but no dysuria, hematuria or urinary frequency. She denies fever, chills, nausea, vomiting, weakness or dizziness. No trauma to the back. Status post lithotripsy with ureteral stent, several months ago. She is taking her usual medications. There are no other known modifying factors.   Past Medical History  Diagnosis Date  . Kidney stone   . Kidney stone   . Migraine    Past Surgical History  Procedure Laterality Date  . Tubal ligation    . Tubal ligation    . Breast surgery    . Cystoscopy with stent placement Left 09/06/2013    Procedure: CYSTOSCOPY WITH STENT PLACEMENT;  Surgeon: Ky Barban, MD;  Location: [facility];  Service: Urology;  Laterality: Left;   History reviewed. No pertinent family history. History  Substance Use Topics  . Smoking status: Never Smoker   . Smokeless tobacco: Not on file  . Alcohol Use: No   OB History    No data available     Review of Systems  All other systems reviewed and are negative.     Allergies  Review of patient's allergies indicates no known allergies.  Home Medications   Prior to Admission medications   Medication Sig Start Date End Date Taking? Authorizing Provider  ibuprofen (ADVIL,MOTRIN) 200 MG tablet Take 400 mg by mouth every 6 (six) hours as needed for moderate pain.   Yes Historical Provider, MD  cephALEXin (KEFLEX) 500 MG capsule Take 1 capsule (500 mg total) by mouth 4 (four) times daily. 03/22/15   Mancel Bale, MD  ondansetron (ZOFRAN) 8 MG tablet Take 1  tablet (8 mg total) by mouth every 8 (eight) hours as needed for nausea or vomiting. Patient not taking: Reported on 11/06/2014 09/04/14   Mancel Bale, MD  oxyCODONE-acetaminophen (PERCOCET) 5-325 MG per tablet Take 1 tablet by mouth every 4 (four) hours as needed for severe pain. 03/22/15   Mancel Bale, MD   BP 110/76 mmHg  Pulse 80  Temp(Src) 98.5 F (36.9 C) (Oral)  Resp 26  Ht  (1.6 m)  Wt 165 lb (74.844 kg)  BMI 29.24 kg/m2  SpO2 100%  LMP 02/23/2015 Physical Exam  Constitutional: She is oriented to person, place, and time. She appears well-developed and well-nourished. No distress.  HENT:  Head: Normocephalic and atraumatic.  Right Ear: External ear normal.  Left Ear: External ear normal.  Eyes: Conjunctivae and EOM are normal. Pupils are equal, round, and reactive to light.  Neck: Normal range of motion and phonation normal. Neck supple.  Cardiovascular: Normal rate, regular rhythm and normal heart sounds.   Pulmonary/Chest: Effort normal and breath sounds normal. She exhibits no bony tenderness.  Abdominal: Soft. She exhibits no distension and no mass. There is no tenderness.  Genitourinary:  There is no costovertebral angle tenderness.  Musculoskeletal: Normal range of motion.  Neurological: She is alert and oriented to person, place, and time. No cranial nerve deficit or sensory deficit. She exhibits normal muscle tone. Coordination normal.  Skin: Skin is warm, dry and  intact.  Psychiatric: She has a normal mood and affect. Her behavior is normal. Judgment and thought content normal.  Nursing note and vitals reviewed.   ED Course  Procedures (including critical care time) Mild flank pain with history of kidney stones, status post lithotripsy. Symptoms mild and nonspecific. Evaluate initially with urinalysis, and treated for pain with Toradol.  Medications  cephALEXin (KEFLEX) capsule 500 mg (not administered)  oxyCODONE-acetaminophen (PERCOCET/ROXICET) 5-325 MG  per tablet 1 tablet (not administered)  ketorolac (TORADOL) injection 60 mg (60 mg Intramuscular Given 03/22/15 1523)  ondansetron (ZOFRAN-ODT) disintegrating tablet 8 mg (8 mg Oral Given 03/22/15 1522)    Patient Vitals for the past 24 hrs:  BP Temp Temp src Pulse Resp SpO2 Height Weight  03/22/15 1745 110/76 mmHg - - 80 26 100 % - -  03/22/15 1456 131/76 mmHg 98.5 F (36.9 C) Oral 105 20 97 % 5\' 3"  (1.6 m) 165 lb (74.844 kg)    6:01 PM Reevaluation with update and discussion. After initial assessment and treatment, an updated evaluation reveals she reports improvement with Toradol, pain is returning somewhat. No additional complaints. Findings discussed with patient and family member, all questions were answered.Mancel Bale L   Labs Review Labs Reviewed  URINALYSIS, ROUTINE W REFLEX MICROSCOPIC (NOT AT [facility]) - Abnormal; Notable for the following:    APPearance HAZY (*)    Specific Gravity, Urine >1.030 (*)    Hgb urine dipstick SMALL (*)    Leukocytes, UA SMALL (*)    All other components within normal limits  URINE MICROSCOPIC-ADD ON - Abnormal; Notable for the following:    Squamous Epithelial / LPF MANY (*)    Bacteria, UA MANY (*)    All other components within normal limits  PREGNANCY, URINE  POC URINE PREG, ED    Imaging Review No results found.   EKG Interpretation None      MDM   Final diagnoses:  UTI (lower urinary tract infection)  Neuropathic pain of flank, left    Nonspecific flank pain, unlikely to be ureteral colic. Likely urinary tract infection without severe pyelonephritis. Doubt serious bacterial infection. Metabolic instability or impending vascular collapse.  Nursing Notes Reviewed/ Care Coordinated Applicable Imaging Reviewed Interpretation of Laboratory Data incorporated into ED treatment  The patient appears reasonably screened and/or stabilized for discharge and I doubt any other medical condition or other [facility] requiring further screening,  evaluation, or treatment in the ED at this time prior to discharge.  Plan: Home Medications- Keflex, Percocet; Home Treatments- rest, fluids; return here if the recommended treatment, does not improve the symptoms; Recommended follow up- PCP, when necessary     Mancel Bale, MD 03/22/15 8119

## 2015-06-12 ENCOUNTER — Encounter (HOSPITAL_BASED_OUTPATIENT_CLINIC_OR_DEPARTMENT_OTHER): Payer: Self-pay | Admitting: Emergency Medicine

## 2015-06-12 ENCOUNTER — Emergency Department (HOSPITAL_BASED_OUTPATIENT_CLINIC_OR_DEPARTMENT_OTHER)
Admission: EM | Admit: 2015-06-12 | Discharge: 2015-06-12 | Disposition: A | Discharge status: Home / Self Care | Payer: Self-pay | Attending: Emergency Medicine | Admitting: Emergency Medicine

## 2015-06-12 DIAGNOSIS — Z8679 Personal history of other diseases of the circulatory system: Secondary | ICD-10-CM | POA: Insufficient documentation

## 2015-06-12 DIAGNOSIS — Z87442 Personal history of urinary calculi: Secondary | ICD-10-CM | POA: Insufficient documentation

## 2015-06-12 DIAGNOSIS — K529 Noninfective gastroenteritis and colitis, unspecified: Secondary | ICD-10-CM | POA: Insufficient documentation

## 2015-06-12 MED ORDER — SODIUM CHLORIDE 0.9 % IV BOLUS (SEPSIS)
1000.0000 mL | Freq: Once | INTRAVENOUS | Status: AC
  Administered 2015-06-12: 1000 mL via INTRAVENOUS

## 2015-06-12 MED ORDER — KETOROLAC TROMETHAMINE 30 MG/ML IJ SOLN
30.0000 mg | Freq: Once | INTRAMUSCULAR | Status: AC
  Administered 2015-06-12: 30 mg via INTRAVENOUS
  Filled 2015-06-12: qty 1

## 2015-06-12 MED ORDER — ONDANSETRON HCL 4 MG/2ML IJ SOLN
4.0000 mg | Freq: Once | INTRAMUSCULAR | Status: AC
  Administered 2015-06-12: 4 mg via INTRAVENOUS
  Filled 2015-06-12: qty 2

## 2015-06-12 MED ORDER — BISMUTH SUBSALICYLATE 262 MG PO CHEW: 524.0000 mg | CHEWABLE_TABLET | ORAL | Status: DC | PRN

## 2015-06-12 MED ORDER — ACETAMINOPHEN 500 MG PO TABS
1000.0000 mg | ORAL_TABLET | Freq: Once | ORAL | Status: AC
  Administered 2015-06-12: 1000 mg via ORAL
  Filled 2015-06-12: qty 2

## 2015-06-12 NOTE — Discharge Instructions (Signed)

## 2015-06-12 NOTE — ED Notes (Signed)
[facility] called to pick [facility] patient, d/c pending.

## 2015-06-12 NOTE — ED Provider Notes (Signed)
CSN: [id number]     Arrival date & time 06/12/15  1037 History   First MD Initiated Contact with Patient 06/12/15 1054     Chief Complaint  Patient presents with  . Abdominal Pain    [facility]     (Consider location/radiation/quality/duration/timing/severity/associated sxs/prior Treatment) Patient is a 34 y.o. female presenting with diarrhea. The history is provided by the patient.  Diarrhea Quality:  Semi-solid Severity:  Moderate Onset quality:  Gradual Number of episodes:  4/day Duration:  3 days Timing:  Constant Progression:  Unchanged Relieved by:  Nothing Worsened by:  Nothing tried Ineffective treatments:  None tried Associated symptoms: abdominal pain (diffuse) and vomiting   Associated symptoms: no recent cough and no fever   Abdominal pain:    Location:  Generalized   Quality:  Cramping and burning   Severity:  Moderate   Onset quality:  Gradual   Timing:  Constant   Progression:  Unchanged   Chronicity:  New Vomiting:    Number of occurrences:  10x   Severity:  Mild   Duration:  1 day   Timing:  Constant   Progression:  Unchanged Risk factors: no recent antibiotic use, no sick contacts, no suspicious food intake and no travel to endemic areas     Past Medical History  Diagnosis Date  . Kidney stone   . Kidney stone   . Migraine    Past Surgical History  Procedure Laterality Date  . Tubal ligation    . Tubal ligation    . Breast surgery    . Cystoscopy with stent placement Left 09/06/2013    Procedure: CYSTOSCOPY WITH STENT PLACEMENT;  Surgeon: Ky Barban, MD;  Location: [facility];  Service: Urology;  Laterality: Left;   No family history on file. Social History  Substance Use Topics  . Smoking status: Never Smoker   . Smokeless tobacco: None  . Alcohol Use: No   OB History    No data available     Review of Systems  Constitutional: Negative for fever.  Gastrointestinal: Positive for vomiting, abdominal pain (diffuse) and  diarrhea.  All other systems reviewed and are negative.     Allergies  Review of patient's allergies indicates no known allergies.  Home Medications   Prior to Admission medications   Not on File   BP 116/64 mmHg  Pulse 78  Temp(Src) 98.8 F (37.1 C) (Oral)  Resp 18  Ht  (1.651 m)  Wt 160 lb (72.576 kg)  BMI 26.63 kg/m2  SpO2 99%  LMP 05/26/2015 Physical Exam  Constitutional: She is oriented to person, place, and time. She appears well-developed and well-nourished. No distress.  HENT:  Head: Normocephalic.  Eyes: Conjunctivae are normal.  Neck: Neck supple. No tracheal deviation present.  Cardiovascular: Normal rate, regular rhythm and normal heart sounds.   Pulmonary/Chest: Effort normal and breath sounds normal. No respiratory distress.  Abdominal: Soft. She exhibits no distension. There is no tenderness. There is no rebound and no guarding.  Neurological: She is alert and oriented to person, place, and time.  Skin: Skin is warm and dry.  Psychiatric: She has a normal mood and affect.  Vitals reviewed.   ED Course  Procedures (including critical care time) Labs Review Labs Reviewed - No data to display  Imaging Review No results found. I have personally reviewed and evaluated these images and lab results as part of my medical decision-making.   EKG Interpretation None      MDM  Final diagnoses:  Gastroenteritis    Patient presents with symptoms c/w a viral gastroenteritis (vomiting, diarrhea) for 3 days. Patient appears well. No signs of toxicity, patient is interactive and not in distress. No signs of clinical dehydration. Doubt appendicitis, and no evidence of any other illness. AFVSS. Discussed symptomatic treatment. Plan to follow up with PCP as needed and return precautions discussed for worsening or new concerning symptoms.    Lyndal Pulleyaniel Zaylee Cornia, MD 06/12/15 [phone]

## 2015-06-12 NOTE — ED Notes (Signed)
Generalized abdominal pain x 3 days along with N/V/d.  No fever.

## 2015-10-30 ENCOUNTER — Encounter (HOSPITAL_COMMUNITY): Payer: Self-pay | Admitting: *Deleted

## 2015-10-30 ENCOUNTER — Emergency Department (HOSPITAL_COMMUNITY): Payer: Self-pay

## 2015-10-30 ENCOUNTER — Emergency Department (HOSPITAL_COMMUNITY)
Admission: EM | Admit: 2015-10-30 | Discharge: 2015-10-30 | Disposition: A | Discharge status: Home / Self Care | Payer: Self-pay | Attending: Emergency Medicine | Admitting: Emergency Medicine

## 2015-10-30 DIAGNOSIS — Z8679 Personal history of other diseases of the circulatory system: Secondary | ICD-10-CM | POA: Insufficient documentation

## 2015-10-30 DIAGNOSIS — M25561 Pain in right knee: Secondary | ICD-10-CM

## 2015-10-30 DIAGNOSIS — W14XXXA Fall from tree, initial encounter: Secondary | ICD-10-CM | POA: Insufficient documentation

## 2015-10-30 DIAGNOSIS — Z87442 Personal history of urinary calculi: Secondary | ICD-10-CM | POA: Insufficient documentation

## 2015-10-30 DIAGNOSIS — Y998 Other external cause status: Secondary | ICD-10-CM | POA: Insufficient documentation

## 2015-10-30 DIAGNOSIS — S8991XA Unspecified injury of right lower leg, initial encounter: Secondary | ICD-10-CM | POA: Insufficient documentation

## 2015-10-30 DIAGNOSIS — Y9389 Activity, other specified: Secondary | ICD-10-CM | POA: Insufficient documentation

## 2015-10-30 DIAGNOSIS — Y9289 Other specified places as the place of occurrence of the external cause: Secondary | ICD-10-CM | POA: Insufficient documentation

## 2015-10-30 MED ORDER — DICLOFENAC SODIUM 75 MG PO TBEC: 75.0000 mg | DELAYED_RELEASE_TABLET | Freq: Two times a day (BID) | ORAL | Status: DC

## 2015-10-30 NOTE — ED Provider Notes (Signed)
CSN: [id number]     Arrival date & time 10/30/15  1327 History   First MD Initiated Contact with Patient 10/30/15 1350     Chief Complaint  Patient presents with  . Knee Injury     (Consider location/radiation/quality/duration/timing/severity/associated sxs/prior Treatment) HPI   [name] Koch is a 35 y.o. female who presents to the Emergency Department complaining of sudden onset of right knee pain after a mechanical fall yesterday. She states she tripped over a tree branch and fell directly onto her right knee. Pain is worse with weightbearing and bending. She reports associated swelling of the knee. Pain improves with rest. She has not taken any medications for her symptoms. She denies numbness or weakness of the extremity, redness, hip pain or other injuries.   Past Medical History  Diagnosis Date  . Kidney stone   . Kidney stone   . Migraine    Past Surgical History  Procedure Laterality Date  . Tubal ligation    . Tubal ligation    . Breast surgery    . Cystoscopy with stent placement Left 09/06/2013    Procedure: CYSTOSCOPY WITH STENT PLACEMENT;  Surgeon: Ky Barban, MD;  Location: [facility];  Service: Urology;  Laterality: Left;   No family history on file. Social History  Substance Use Topics  . Smoking status: Never Smoker   . Smokeless tobacco: None  . Alcohol Use: No   OB History    No data available     Review of Systems  Constitutional: Negative for fever and chills.  Musculoskeletal: Positive for joint swelling and arthralgias (Right knee pain).  Skin: Negative for color change and wound.  Neurological: Negative for weakness and numbness.  All other systems reviewed and are negative.     Allergies  Tylenol with codeine #3  Home Medications   Prior to Admission medications   Medication Sig Start Date End Date Taking? Authorizing Provider  diphenhydramine-acetaminophen (TYLENOL PM) 25-500 MG TABS tablet Take 1 tablet by mouth at bedtime.    Yes Historical Provider, MD  bismuth subsalicylate (PEPTO-BISMOL) 262 MG chewable tablet Chew 2 tablets (524 mg total) by mouth as needed. Patient not taking: Reported on 10/30/2015 06/12/15   Lyndal Pulley, MD  diclofenac (VOLTAREN) 75 MG EC tablet Take 1 tablet (75 mg total) by mouth 2 (two) times daily. Take with food 10/30/15   Washington Whedbee, PA-C   BP 113/62 mmHg  Pulse 88  Temp(Src) 98.3 F (36.8 C) (Oral)  Resp 16  Ht  (1.651 m)  Wt 72.576 kg  BMI 26.63 kg/m2  SpO2 98%  LMP 10/30/2015 Physical Exam  Constitutional: She is oriented to person, place, and time. She appears well-developed and well-nourished. No distress.  Neck: Normal range of motion.  Cardiovascular: Normal rate, regular rhythm and intact distal pulses.   Pulmonary/Chest: Effort normal and breath sounds normal. No respiratory distress. She exhibits no tenderness.  Musculoskeletal: She exhibits tenderness.  Diffuse ttp of the anterior right knee.  Mild crepitus to palpation of the patella No erythema, edema, effusion, or step-off deformity.  DP pulse brisk, distal sensation intact. Calf is soft and NT.  Neurological: She is alert and oriented to person, place, and time. She exhibits normal muscle tone. Coordination normal.  Skin: Skin is warm and dry. No erythema.  Nursing note and vitals reviewed.   ED Course  Procedures (including critical care time) Labs Review Labs Reviewed - No data to display  Imaging Review Dg Knee Complete 4  Views Right  10/30/2015  CLINICAL DATA:  Fall onto right knee yesterday with persistent pain, initial encounter EXAM: RIGHT KNEE - COMPLETE 4+ VIEW COMPARISON:  None. FINDINGS: There is no evidence of fracture, dislocation, or joint effusion. There is no evidence of arthropathy or other focal bone abnormality. Soft tissues are unremarkable. IMPRESSION: No acute abnormality noted. Electronically Signed   By: Alcide Clever M.D.   On: 10/30/2015 14:15   I have personally reviewed and  evaluated these images and lab results as part of my medical decision-making.   EKG Interpretation None      MDM   Final diagnoses:  Knee pain, right    Pt is well appearing.  Vitals are stable. No evidence of septic joint. No effusion on exam or x-ray. Likely sprain.  Ace wrap applied for support, patient given instructions for proper wear. Patient agrees to RICE therapy and close orthopedic follow-up if needed. She appears stable for discharge.    Pauline Aus, PA-C 10/30/15 1453  Margarita Grizzle, MD 10/30/15 1538

## 2015-10-30 NOTE — Discharge Instructions (Signed)
Knee Pain  Knee pain is a common problem. It can have many causes. The pain often goes away by following your doctor's home care instructions. Treatment for ongoing pain will depend on the cause of your pain. If your knee pain continues, more tests may be needed to diagnose your condition. Tests may include X-rays or other imaging studies of your knee.  HOME CARE   Take medicines only as told by your doctor.   Rest your knee and keep it raised (elevated) while you are resting.   Do not do things that cause pain or make your pain worse.   Avoid activities where both feet leave the ground at the same time, such as running, jumping rope, or doing jumping jacks.   Apply ice to the knee area:    Put ice in a plastic bag.    Place a towel between your skin and the bag.    Leave the ice on for 20 minutes, 2-3 times a day.   Ask your doctor if you should wear an elastic knee support.   Sleep with a pillow under your knee.   Lose weight if you are overweight. Being overweight can make your knee hurt more.   Do not use any tobacco products, including cigarettes, chewing tobacco, or electronic cigarettes. If you need help quitting, ask your doctor. Smoking may slow the healing of any bone and joint problems that you may have.  GET HELP IF:   Your knee pain does not stop, it changes, or it gets worse.   You have a fever along with knee pain.   Your knee gives out or locks up.   Your knee becomes more swollen.  GET HELP RIGHT AWAY IF:    Your knee feels hot to the touch.   You have chest pain or trouble breathing.     This information is not intended to replace advice given to you by your health care provider. Make sure you discuss any questions you have with your health care provider.     Document Released: 11/12/2008 Document Revised: 09/06/2014 Document Reviewed: 10/17/2013  Elsevier Interactive Patient Education 2016 [employer]

## 2015-10-30 NOTE — ED Notes (Signed)
Pt states right knee pain after tripping over a branch and falling on asphalt yesterday.

## 2016-01-19 ENCOUNTER — Encounter: Payer: Self-pay | Admitting: Family Medicine

## 2016-01-19 ENCOUNTER — Ambulatory Visit: Payer: Self-pay | Admitting: Family Medicine

## 2016-01-19 ENCOUNTER — Ambulatory Visit (INDEPENDENT_AMBULATORY_CARE_PROVIDER_SITE_OTHER): Payer: Worker's Compensation | Admitting: Family Medicine

## 2016-01-19 VITALS — BP 109/70 | HR 99 | Temp 98.6°F | Ht 65.0 in | Wt 177.0 lb

## 2016-01-19 DIAGNOSIS — S93401A Sprain of unspecified ligament of right ankle, initial encounter: Secondary | ICD-10-CM

## 2016-01-19 MED ORDER — MELOXICAM 15 MG PO TABS: 15.0000 mg | ORAL_TABLET | Freq: Every day | ORAL | Status: DC

## 2016-01-19 NOTE — Progress Notes (Signed)
BP 109/70 mmHg  Pulse 99  Temp(Src) 98.6 F (37 C) (Oral)  Ht  (1.651 m)  Wt 177 lb (80.287 kg)  BMI 29.45 kg/m2   Subjective:    Patient ID: [name] Koch, female    DOB: [birth year], 35 y.o.   MRN: [id number]  HPI: [name] Koch is a 35 y.o. female presenting on 01/19/2016 for Right ankle pain   HPI Worker's Comp./right ankle Patient comes in today for a worker's comp visit/right ankle pain. She was working at [employer] on 01/15/2016 and she was coming down some steps from the cat walk and rolled her ankle to the outside on that right side. She went in to [facility] emergency department on that day and had right ankle x-ray which showed swelling but no fractures. We cannot see the actual x-ray images just the report. Today she comes in and she is still having significant swelling in that right ankle but the bruising has healed. The pain is still significant. She was given some Norco but said that was too strong for her and she could not tolerate it. She would like something a little less strong and more in anti-inflammatory department. She has been taking some ibuprofen. She took ibuprofen 400 mg today. She is able to bear weight on it but it definitely causes her to limp. Her pain is mostly on the lateral aspect near her malleolus.  Relevant past medical, surgical, family and social history reviewed and updated as indicated. Interim medical history since our last visit reviewed. Allergies and medications reviewed and updated.  Review of Systems  Constitutional: Negative for fever and chills.  HENT: Negative for congestion, ear discharge and ear pain.   Eyes: Negative for redness and visual disturbance.  Respiratory: Negative for chest tightness and shortness of breath.   Cardiovascular: Positive for leg swelling. Negative for chest pain.  Genitourinary: Negative for dysuria and difficulty urinating.  Musculoskeletal: Positive for joint swelling and arthralgias. Negative for back  pain and gait problem.  Skin: Negative for color change and rash.  Neurological: Negative for light-headedness and headaches.  Psychiatric/Behavioral: Negative for behavioral problems and agitation.  All other systems reviewed and are negative.   Per HPI unless specifically indicated above     Medication List       This list is accurate as of: 01/19/16  3:33 PM.  Always use your most recent med list.               HYDROcodone-acetaminophen 10-325 MG tablet  Commonly known as:  NORCO  Take 1 tablet by mouth every 6 (six) hours as needed.     meloxicam 15 MG tablet  Commonly known as:  MOBIC  Take 1 tablet (15 mg total) by mouth daily.           Objective:    BP 109/70 mmHg  Pulse 99  Temp(Src) 98.6 F (37 C) (Oral)  Ht  (1.651 m)  Wt 177 lb (80.287 kg)  BMI 29.45 kg/m2  Wt Readings from Last 3 Encounters:  01/19/16 177 lb (80.287 kg)  10/30/15 160 lb (72.576 kg)  06/12/15 160 lb (72.576 kg)    Physical Exam  Constitutional: She is oriented to person, place, and time. She appears well-developed and well-nourished. No distress.  Eyes: Conjunctivae and EOM are normal. Pupils are equal, round, and reactive to light.  Cardiovascular: Normal rate, regular rhythm, normal heart sounds and intact distal pulses.   No murmur heard. Pulmonary/Chest: Effort normal  and breath sounds normal. No respiratory distress. She has no wheezes.  Musculoskeletal: Normal range of motion. She exhibits edema and tenderness (Pain over lateral ankle anterior to malleolus and posterior malleolus).  Neurological: She is alert and oriented to person, place, and time. Coordination normal.  Skin: Skin is warm and dry. No rash noted. She is not diaphoretic.  Psychiatric: She has a normal mood and affect. Her behavior is normal.  Nursing note and vitals reviewed.   Results for orders placed or performed during the hospital encounter of 03/22/15  Pregnancy, urine  Result Value Ref Range    Preg Test, Ur NEGATIVE NEGATIVE  Urinalysis, Routine w reflex microscopic (not at [facility])  Result Value Ref Range   Color, Urine YELLOW YELLOW   APPearance HAZY (A) CLEAR   Specific Gravity, Urine >1.030 (H) 1.005 - 1.030   pH 6.0 5.0 - 8.0   Glucose, UA NEGATIVE NEGATIVE mg/dL   Hgb urine dipstick SMALL (A) NEGATIVE   Bilirubin Urine NEGATIVE NEGATIVE   Ketones, ur NEGATIVE NEGATIVE mg/dL   Protein, ur NEGATIVE NEGATIVE mg/dL   Urobilinogen, UA 0.2 0.0 - 1.0 mg/dL   Nitrite NEGATIVE NEGATIVE   Leukocytes, UA SMALL (A) NEGATIVE  Urine microscopic-add on  Result Value Ref Range   Squamous Epithelial / LPF MANY (A) RARE   WBC, UA 11-20 <3 WBC/hpf   RBC / HPF 7-10 <3 RBC/hpf   Bacteria, UA MANY (A) RARE  POC Urine Pregnancy, ED (do NOT order at [facility])  Result Value Ref Range   Preg Test, Ur NEGATIVE NEGATIVE      Assessment & Plan:   Problem List Items Addressed This Visit    None    Visit Diagnoses    Right ankle sprain, initial encounter    -  Primary    Give Mobic, cleared for light duty with no prolonged standing or walking. Continue ankle brace for next 3 days, where shoes with support    Relevant Medications    meloxicam (MOBIC) 15 MG tablet        Follow up plan: Return if symptoms worsen or fail to improve.  Counseling provided for all of the vaccine components No orders of the defined types were placed in this encounter.    Arville CareJoshua Dettinger, MD [facility] 01/19/2016, 3:33 PM

## 2016-02-27 ENCOUNTER — Encounter (HOSPITAL_COMMUNITY): Payer: Self-pay | Admitting: Cardiology

## 2016-02-27 ENCOUNTER — Emergency Department (HOSPITAL_COMMUNITY)
Admission: EM | Admit: 2016-02-27 | Discharge: 2016-02-27 | Disposition: A | Discharge status: Home / Self Care | Payer: Self-pay | Attending: Emergency Medicine | Admitting: Emergency Medicine

## 2016-02-27 DIAGNOSIS — W57XXXA Bitten or stung by nonvenomous insect and other nonvenomous arthropods, initial encounter: Secondary | ICD-10-CM | POA: Insufficient documentation

## 2016-02-27 DIAGNOSIS — Y939 Activity, unspecified: Secondary | ICD-10-CM | POA: Insufficient documentation

## 2016-02-27 DIAGNOSIS — S70362A Insect bite (nonvenomous), left thigh, initial encounter: Secondary | ICD-10-CM | POA: Insufficient documentation

## 2016-02-27 DIAGNOSIS — Y999 Unspecified external cause status: Secondary | ICD-10-CM | POA: Insufficient documentation

## 2016-02-27 DIAGNOSIS — Y929 Unspecified place or not applicable: Secondary | ICD-10-CM | POA: Insufficient documentation

## 2016-02-27 MED ORDER — IBUPROFEN 800 MG PO TABS: 800.0000 mg | ORAL_TABLET | Freq: Three times a day (TID) | ORAL | Status: DC

## 2016-02-27 MED ORDER — SULFAMETHOXAZOLE-TRIMETHOPRIM 800-160 MG PO TABS: 1.0000 | ORAL_TABLET | Freq: Two times a day (BID) | ORAL | Status: AC

## 2016-02-27 NOTE — ED Provider Notes (Signed)
CSN: [id number]     Arrival date & time 02/27/16  1815 History   First MD Initiated Contact with Patient 02/27/16 1844     Chief Complaint  Patient presents with  . Insect Bite     (Consider location/radiation/quality/duration/timing/severity/associated sxs/prior Treatment) HPI  [name] Koch is a 35 y.o. female who presents to the Emergency Department complaining of a bee sting to her left thigh that occurred two days prior to ER arrival.  She complains of pain to the area with increasing redness at the site.  Believes she was bitten by an insect.   She has applied benadryl cream and took benadryl earlier without relief.  She denies difficulty swallowing, shortness of breath, fever or chills.  Past Medical History  Diagnosis Date  . Kidney stone   . Kidney stone   . Migraine    Past Surgical History  Procedure Laterality Date  . Tubal ligation    . Tubal ligation    . Breast surgery    . Cystoscopy with stent placement Left 09/06/2013    Procedure: CYSTOSCOPY WITH STENT PLACEMENT;  Surgeon: Ky BarbanMohammad I Javaid, MD;  Location: [facility];  Service: Urology;  Laterality: Left;   History reviewed. No pertinent family history. Social History  Substance Use Topics  . Smoking status: Never Smoker   . Smokeless tobacco: None  . Alcohol Use: No   OB History    No data available     Review of Systems  Constitutional: Negative for fever, chills, activity change and appetite change.  HENT: Negative for facial swelling, sore throat and trouble swallowing.   Respiratory: Negative for chest tightness, shortness of breath and wheezing.   Musculoskeletal: Negative for neck pain and neck stiffness.  Skin: Positive for color change. Negative for wound.       Pain redness to left thigh  Neurological: Negative for dizziness, weakness, numbness and headaches.  All other systems reviewed and are negative.     Allergies  Tylenol with codeine #3  Home Medications   Prior to Admission  medications   Medication Sig Start Date End Date Taking? Authorizing Provider  HYDROcodone-acetaminophen (NORCO) 10-325 MG tablet Take 1 tablet by mouth every 6 (six) hours as needed.    Historical Provider, MD  meloxicam (MOBIC) 15 MG tablet Take 1 tablet (15 mg total) by mouth daily. 01/19/16   Elige RadonJoshua A Dettinger, MD   BP 118/67 mmHg  Pulse 84  Temp(Src) 98 F (36.7 C) (Oral)  Resp 18  Ht 5\' 5"  (1.651 m)  Wt 72.576 kg  BMI 26.63 kg/m2  SpO2 97% Physical Exam  Constitutional: She is oriented to person, place, and time. She appears well-developed and well-nourished. No distress.  HENT:  Head: Normocephalic and atraumatic.  Mouth/Throat: Oropharynx is clear and moist.  Neck: Normal range of motion. Neck supple.  Cardiovascular: Normal rate, regular rhythm, normal heart sounds and intact distal pulses.   No murmur heard. Pulmonary/Chest: Effort normal and breath sounds normal. No respiratory distress. She has no wheezes.  Musculoskeletal: She exhibits no edema or tenderness.  Lymphadenopathy:    She has no cervical adenopathy.  Neurological: She is alert and oriented to person, place, and time. She exhibits normal muscle tone. Coordination normal.  Skin: Skin is warm. There is erythema.  5 cm erythematous  Macule the anterior left thigh.  No fluctuance.    Nursing note and vitals reviewed.   ED Course  Procedures (including critical care time) Labs Review Labs Reviewed - No  data to display  Imaging Review No results found. I have personally reviewed and evaluated these images and lab results as part of my medical decision-making.   EKG Interpretation None      MDM   Final diagnoses:  Insect bite    Localized erythema to the left upper thigh. Appears consistent with insect bite. No obvious abscess at this time. Leading edge of erythema was marked by me. Patient agrees to warm compresses and ER return if needed.    Pauline Ausammy Geno Sydnor, PA-C 02/29/16 1219  Tyger Wichman, PA-C 02/29/16 1226  Vanetta MuldersScott Zackowski, MD 03/01/16 2223

## 2016-02-27 NOTE — ED Notes (Signed)
?   Insect bite to right thigh  Times 2 days.

## 2016-05-22 DIAGNOSIS — N83202 Unspecified ovarian cyst, left side: Secondary | ICD-10-CM | POA: Insufficient documentation

## 2016-05-22 DIAGNOSIS — Z791 Long term (current) use of non-steroidal anti-inflammatories (NSAID): Secondary | ICD-10-CM | POA: Insufficient documentation

## 2016-05-23 ENCOUNTER — Emergency Department (HOSPITAL_COMMUNITY): Payer: Self-pay

## 2016-05-23 ENCOUNTER — Encounter (HOSPITAL_COMMUNITY): Payer: Self-pay | Admitting: *Deleted

## 2016-05-23 ENCOUNTER — Emergency Department (HOSPITAL_COMMUNITY)
Admission: EM | Admit: 2016-05-23 | Discharge: 2016-05-23 | Disposition: A | Discharge status: Home / Self Care | Payer: Self-pay | Attending: Emergency Medicine | Admitting: Emergency Medicine

## 2016-05-23 DIAGNOSIS — N83202 Unspecified ovarian cyst, left side: Secondary | ICD-10-CM

## 2016-05-23 LAB — CBC WITH DIFFERENTIAL/PLATELET
Basophils Absolute: 0 10*3/uL (ref 0.0–0.1)
Basophils Relative: 1 %
EOS PCT: 1 %
Eosinophils Absolute: 0.1 10*3/uL (ref 0.0–0.7)
HCT: 40.6 % (ref 36.0–46.0)
Hemoglobin: 13.3 g/dL (ref 12.0–15.0)
LYMPHS ABS: 1.3 10*3/uL (ref 0.7–4.0)
LYMPHS PCT: 33 %
MCH: 28.1 pg (ref 26.0–34.0)
MCHC: 32.8 g/dL (ref 30.0–36.0)
MCV: 85.8 fL (ref 78.0–100.0)
Monocytes Absolute: 0.3 10*3/uL (ref 0.1–1.0)
Monocytes Relative: 8 %
Neutro Abs: 2.3 10*3/uL (ref 1.7–7.7)
Neutrophils Relative %: 57 %
Platelets: 159 10*3/uL (ref 150–400)
RBC: 4.73 MIL/uL (ref 3.87–5.11)
RDW: 12.8 % (ref 11.5–15.5)
WBC: 4 10*3/uL (ref 4.0–10.5)

## 2016-05-23 LAB — COMPREHENSIVE METABOLIC PANEL
ALBUMIN: 4.7 g/dL (ref 3.5–5.0)
ALT: 28 U/L (ref 14–54)
AST: 32 U/L (ref 15–41)
Alkaline Phosphatase: 57 U/L (ref 38–126)
Anion gap: 7 (ref 5–15)
BUN: 16 mg/dL (ref 6–20)
CO2: 26 mmol/L (ref 22–32)
CREATININE: 0.89 mg/dL (ref 0.44–1.00)
Calcium: 10.8 mg/dL — ABNORMAL HIGH (ref 8.9–10.3)
Chloride: 103 mmol/L (ref 101–111)
GFR calc Af Amer: >60 mL/min (ref >60)
GFR calc non Af Amer: >60 mL/min (ref >60)
GLUCOSE: 97 mg/dL (ref 65–99)
Potassium: 3.6 mmol/L (ref 3.5–5.1)
SODIUM: 136 mmol/L (ref 135–145)
Total Bilirubin: 0.6 mg/dL (ref 0.3–1.2)
Total Protein: 8.1 g/dL (ref 6.5–8.1)

## 2016-05-23 LAB — URINALYSIS, ROUTINE W REFLEX MICROSCOPIC
Glucose, UA: NEGATIVE mg/dL
Hgb urine dipstick: NEGATIVE
LEUKOCYTES UA: NEGATIVE
Nitrite: NEGATIVE
Protein, ur: NEGATIVE mg/dL
Specific Gravity, Urine: >1.03 — ABNORMAL HIGH (ref 1.005–1.030)
pH: 6 (ref 5.0–8.0)

## 2016-05-23 LAB — PREGNANCY, URINE: Preg Test, Ur: NEGATIVE

## 2016-05-23 LAB — LIPASE, BLOOD: Lipase: 11 U/L (ref 11–51)

## 2016-05-23 MED ORDER — MORPHINE SULFATE (PF) 4 MG/ML IV SOLN
4.0000 mg | Freq: Once | INTRAVENOUS | Status: AC
  Administered 2016-05-23: 4 mg via INTRAVENOUS
  Filled 2016-05-23: qty 1

## 2016-05-23 MED ORDER — ONDANSETRON HCL 4 MG/2ML IJ SOLN
4.0000 mg | Freq: Once | INTRAMUSCULAR | Status: AC
  Administered 2016-05-23: 4 mg via INTRAVENOUS
  Filled 2016-05-23: qty 2

## 2016-05-23 MED ORDER — SODIUM CHLORIDE 0.9 % IV SOLN
1000.0000 mL | INTRAVENOUS | Status: DC
  Administered 2016-05-23: 1000 mL via INTRAVENOUS

## 2016-05-23 MED ORDER — SODIUM CHLORIDE 0.9 % IV SOLN
1000.0000 mL | Freq: Once | INTRAVENOUS | Status: AC
  Administered 2016-05-23: 1000 mL via INTRAVENOUS

## 2016-05-23 MED ORDER — KETOROLAC TROMETHAMINE 30 MG/ML IJ SOLN
30.0000 mg | Freq: Once | INTRAMUSCULAR | Status: AC
  Administered 2016-05-23: 30 mg via INTRAVENOUS
  Filled 2016-05-23: qty 1

## 2016-05-23 MED ORDER — IOPAMIDOL (ISOVUE-300) INJECTION 61%
100.0000 mL | Freq: Once | INTRAVENOUS | Status: AC | PRN
  Administered 2016-05-23: 100 mL via INTRAVENOUS

## 2016-05-23 MED ORDER — IOPAMIDOL (ISOVUE-300) INJECTION 61%
INTRAVENOUS | Status: DC
Start: 2016-05-23 — End: 2016-05-23
  Filled 2016-05-23: qty 30

## 2016-05-23 NOTE — ED Provider Notes (Signed)
[facility]-EM[facility]Y [facility]PT Provider Note   CSN: [id number] Arrival date & time: 05/22/16  2358  By signing my name below, I, Modena JanskyAlbert Thayil, attest that this documentation has been prepared under the direction and in the presence of Dione Boozeavid Antwaine Boomhower, MD . Electronically Signed: Modena JanskyAlbert Thayil, Scribe. 05/23/2016. 12:23 AM.  History   Chief Complaint Chief Complaint  Patient presents with  . Abdominal Pain   The history is provided by the patient. No language interpreter was used.   HPI Comments: [name] Hallermanda A Olberding is a 35 y.o. female with a hx of kidney stones who presents to the Emergency Department complaining of constant moderate lower midline abdominal pain that started a few hours ago. Pt describes the worsening pain as sharp, radiating to lower back, 8/10 in severity, exacerbated by standing up, and relieved by sitting back. She currently has associated mild nausea. Her LMNP started about a week ago. She has a hx of similar pain and hx of tubal ligation. Denies any vomiting.   PCP: Dr. Aundria Rudknolton  Past Medical History:  Diagnosis Date  . Kidney stone   . Kidney stone   . Migraine     There are no active problems to display for this patient.   Past Surgical History:  Procedure Laterality Date  . BREAST SURGERY    . CYSTOSCOPY WITH STENT PLACEMENT Left 09/06/2013   Procedure: CYSTOSCOPY WITH STENT PLACEMENT;  Surgeon: Ky BarbanMohammad I Javaid, MD;  Location: [facility];  Service: Urology;  Laterality: Left;  . TUBAL LIGATION    . TUBAL LIGATION      OB History    No data available       Home Medications    Prior to Admission medications   Medication Sig Start Date End Date Taking? Authorizing Provider  ibuprofen (ADVIL,MOTRIN) 800 MG tablet Take 1 tablet (800 mg total) by mouth 3 (three) times daily. 02/27/16   Tammy Triplett, PA-C    Family History No family history on file.  Social History Social History  Substance Use Topics  . Smoking status: Never Smoker  . Smokeless tobacco: Never  Used  . Alcohol use No     Allergies   Tylenol with codeine #3 [acetaminophen-codeine]   Review of Systems Review of Systems  Gastrointestinal: Positive for abdominal pain and nausea. Negative for vomiting.  All other systems reviewed and are negative.    Physical Exam Updated Vital Signs BP (!) 109/54   Pulse 90   Temp 97.7 F (36.5 C) (Oral)   Resp 20   Ht 5\' 5"  (1.651 m)   Wt 160 lb (72.6 kg)   LMP 05/15/2016   SpO2 99%   BMI 26.63 kg/m   Physical Exam  Constitutional: She is oriented to person, place, and time. She appears well-developed and well-nourished.  HENT:  Head: Normocephalic and atraumatic.  Eyes: EOM are normal. Pupils are equal, round, and reactive to light.  Neck: Normal range of motion. Neck supple. No JVD present.  Cardiovascular: Normal rate, regular rhythm and normal heart sounds.   No murmur heard. Pulmonary/Chest: Effort normal and breath sounds normal. She has no wheezes. She has no rales. She exhibits no tenderness.  Abdominal: Soft. She exhibits no distension and no mass. There is tenderness. There is no rebound and no guarding.  Moderate TTP in the lower abdomen. No rebound or guarding. No CVA TTP. Decreased bowel sounds.  Musculoskeletal: Normal range of motion. She exhibits no edema.  Lymphadenopathy:    She has no cervical adenopathy.  Neurological: She is alert and oriented to person, place, and time. No cranial nerve deficit. She exhibits normal muscle tone. Coordination normal.  Skin: Skin is warm and dry. No rash noted.  Psychiatric: She has a normal mood and affect. Her behavior is normal. Judgment and thought content normal.  Nursing note and vitals reviewed.    ED Treatments / Results  DIAGNOSTIC STUDIES: Oxygen Saturation is 99% on RA, normal by my interpretation.    COORDINATION OF CARE: 12:27 AM- Pt advised of plan for treatment and pt agrees.  Labs (all labs ordered are listed, but only abnormal results are  displayed) Labs Reviewed  URINALYSIS, ROUTINE W REFLEX MICROSCOPIC (NOT AT [facility]) - Abnormal; Notable for the following:       Result Value   Specific Gravity, Urine >1.030 (*)    Bilirubin Urine MODERATE (*)    Ketones, ur TRACE (*)    All other components within normal limits  COMPREHENSIVE METABOLIC PANEL - Abnormal; Notable for the following:    Calcium 10.8 (*)    All other components within normal limits  LIPASE, BLOOD  CBC WITH DIFFERENTIAL/PLATELET  PREGNANCY, URINE    Radiology Ct Abdomen Pelvis W Contrast  Result Date: 05/23/2016 CLINICAL DATA:  Acute onset of moderate lower midline abdominal pain and nausea. Pain radiates to the lower back. Initial encounter. EXAM: CT ABDOMEN AND PELVIS WITH CONTRAST TECHNIQUE: Multidetector CT imaging of the abdomen and pelvis was performed using the standard protocol following bolus administration of intravenous contrast. CONTRAST:  ISOVUE-300 IOPAMIDOL (ISOVUE-300) INJECTION 61% COMPARISON:  CT of the abdomen and pelvis from 09/05/2013 FINDINGS: Lower chest: Mild right basilar atelectasis noted. The visualized portions of the mediastinum are unremarkable. Hepatobiliary: The liver is unremarkable in appearance. The gallbladder is unremarkable in appearance. The common bile duct remains normal in caliber. Pancreas: The pancreas is within normal limits. Spleen: The spleen is unremarkable in appearance. Adrenals/Urinary Tract: The adrenal glands are unremarkable in appearance. Mild bilateral renal scarring is noted, with scattered small right-sided renal cysts. There is no evidence of hydronephrosis. No renal or ureteral stones are identified. Stomach/Bowel: The stomach is unremarkable in appearance. The small bowel is within normal limits. The appendix is normal in caliber, without evidence of appendicitis. The colon is unremarkable in appearance. Contrast progresses to the level of the mid transverse colon. Vascular/Lymphatic: The abdominal aorta  is unremarkable in appearance. The inferior vena cava is grossly unremarkable. No retroperitoneal lymphadenopathy is seen. No pelvic sidewall lymphadenopathy is identified. Reproductive: The bladder is moderately distended and within normal limits. The uterus is grossly unremarkable in appearance. The ovaries are relatively symmetric. No suspicious adnexal masses are seen. A 2.9 cm left adnexal cystic focus likely reflects a follicle. Other: No additional soft tissue abnormalities are seen. Musculoskeletal: No acute osseous abnormalities are identified. The visualized musculature is unremarkable in appearance. IMPRESSION: 1. No acute abnormality seen within the abdomen and pelvis. 2. Small bilateral renal scarring, with scattered small right-sided renal cysts. 3. Mild right basilar atelectasis noted. Electronically Signed   By: Roanna Raider M.D.   On: 05/23/2016 02:48    Procedures Procedures (including critical care time)  Medications Ordered in ED Medications  ondansetron (ZOFRAN) injection 4 mg (not administered)  morphine 4 MG/ML injection 4 mg (not administered)  0.9 %  sodium chloride infusion (not administered)    Followed by  0.9 %  sodium chloride infusion (not administered)  iopamidol (ISOVUE-300) 61 % injection (not administered)     Initial  Impression / Assessment and Plan / ED Course  I have reviewed the triage vital signs and the nursing notes.  Pertinent labs & imaging results that were available during my care of the patient were reviewed by me and considered in my medical decision making (see chart for details).  Clinical Course    Abdominal pain of uncertain cause. There are some soft signs suggesting mild peritoneal irritation. Will send for CT of abdomen and pelvis to rule out appendicitis. She's given morphine for pain.  CT shows presence of ovarian cysts although not excessively large. These are the only thing that might account for her pain. Laboratory workup is  unremarkable including normal urinalysis. She is discharged with instructions to follow-up with her PCP, use over-the-counter NSAIDs and acetaminophen for pain, return should symptoms worsen.  Final Clinical Impressions(s) / ED Diagnoses   Final diagnoses:  Left ovarian cyst    New Prescriptions New Prescriptions   No medications on file   I personally performed the services described in this documentation, which was scribed in my presence. The recorded information has been reviewed and is accurate.      Dione Booze, MD 05/23/16 [phone]

## 2016-05-23 NOTE — ED Notes (Signed)
Pt requesting something for pain, Dr Glick notified, additional orders given,  

## 2016-05-23 NOTE — ED Notes (Signed)
Pt returned from ct,  

## 2016-05-23 NOTE — Discharge Instructions (Signed)
Take ibuprofen or naproxen as needed for pain. You may take acetaminophen along with naproxen or ibuprofen. Return if symptoms are worsening.

## 2016-05-23 NOTE — ED Triage Notes (Signed)
Pt c/o mid center sharp cramping abd pain that started a few hours ago with nausea,

## 2016-10-13 ENCOUNTER — Encounter (HOSPITAL_COMMUNITY): Payer: Self-pay | Admitting: Emergency Medicine

## 2016-10-13 DIAGNOSIS — L03116 Cellulitis of left lower limb: Secondary | ICD-10-CM | POA: Insufficient documentation

## 2016-10-13 DIAGNOSIS — R040 Epistaxis: Secondary | ICD-10-CM | POA: Insufficient documentation

## 2016-10-13 NOTE — ED Triage Notes (Signed)
Pt c/o left great toe pain and ankle pain. The toe is red and warm to the touch. Pt states she had 2 nose bleeds today.

## 2016-10-14 ENCOUNTER — Emergency Department (HOSPITAL_COMMUNITY)
Admission: EM | Admit: 2016-10-14 | Discharge: 2016-10-14 | Disposition: A | Discharge status: Home / Self Care | Payer: Self-pay | Attending: Emergency Medicine | Admitting: Emergency Medicine

## 2016-10-14 DIAGNOSIS — R040 Epistaxis: Secondary | ICD-10-CM

## 2016-10-14 DIAGNOSIS — R609 Edema, unspecified: Secondary | ICD-10-CM

## 2016-10-14 DIAGNOSIS — L03032 Cellulitis of left toe: Secondary | ICD-10-CM

## 2016-10-14 MED ORDER — CEPHALEXIN 500 MG PO CAPS: 500.0000 mg | ORAL_CAPSULE | Freq: Four times a day (QID) | ORAL | 0 refills | Status: DC

## 2016-10-14 MED ORDER — CEPHALEXIN 500 MG PO CAPS
1000.0000 mg | ORAL_CAPSULE | Freq: Once | ORAL | Status: AC
  Administered 2016-10-14: 1000 mg via ORAL
  Filled 2016-10-14: qty 2

## 2016-10-14 NOTE — ED Provider Notes (Signed)
[facility] Provider Note   CSN: [id number] Arrival date & time: 10/13/16  2223  By signing my name below, I, Cynda Acres, attest that this documentation has been prepared under the direction and in the presence of Dione Booze, MD. Electronically Signed: Cynda Acres, Scribe. 10/14/16. 12:22 AM.   History   Chief Complaint Chief Complaint  Patient presents with  . Toe Pain    HPI Comments: [name] Koch is a 36 y.o. female who presents to the Emergency Department complaining of sudden-onset, constant left big toe pain that began a few days ago. Patient has associated green discharge, toe redness, ankle pain, leg swelling, ankles swelling, and epistaxis x2 episodes today. Patient describes her pain as "throbbing". 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 No modifying factors indicated. Patient denies any fever, cold symptom, any other symptoms.   The history is provided by the patient. No language interpreter was used.    Past Medical History:  Diagnosis Date  . Kidney stone   . Kidney stone   . Migraine     There are no active problems to display for this patient.   Past Surgical History:  Procedure Laterality Date  . BREAST SURGERY    . CYSTOSCOPY WITH STENT PLACEMENT Left 09/06/2013   Procedure: CYSTOSCOPY WITH STENT PLACEMENT;  Surgeon: Ky Barban, MD;  Location: [facility];  Service: Urology;  Laterality: Left;  . TUBAL LIGATION    . TUBAL LIGATION      OB History    No data available       Home Medications    Prior to Admission medications   Medication Sig Start Date End Date Taking? Authorizing Provider  ibuprofen (ADVIL,MOTRIN) 800 MG tablet Take 1 tablet (800 mg total) by mouth 3 (three) times daily. 02/27/16   Tammy Triplett, PA-C    Family History No family history on file.  Social History Social History  Substance Use Topics  . Smoking status: Never Smoker  . Smokeless tobacco: Never Used  . Alcohol use No      Allergies   Tylenol with codeine #3 [acetaminophen-codeine]   Review of Systems Review of Systems  Constitutional: Negative for chills and fever.  Musculoskeletal: Positive for arthralgias (right big toe, ankle).  All other systems reviewed and are negative.    Physical Exam Updated Vital Signs BP 109/78   Pulse 111   Temp 98.3 F (36.8 C) (Oral)   Resp 20   Ht 5\' 5"  (1.651 m)   Wt 148 lb (67.1 kg)   LMP 09/29/2016   SpO2 91%   BMI 24.63 kg/m   Physical Exam  Constitutional: She is oriented to person, place, and time. She appears well-developed and well-nourished.  HENT:  Head: Normocephalic and atraumatic.  Bleeding site identified left side of nasal septum, no sign of active bleeding.   Eyes: EOM are normal. Pupils are equal, round, and reactive to light.  Neck: Normal range of motion. Neck supple. No JVD present.  Cardiovascular: Normal rate, regular rhythm and normal heart sounds.   No murmur heard. Pulmonary/Chest: Effort normal and breath sounds normal. She has no wheezes. She has no rales. She exhibits no tenderness.  Abdominal: Soft. Bowel sounds are normal. She exhibits no distension and no mass. There is no tenderness.  Musculoskeletal: Normal range of motion. She exhibits no edema.  1-2+ pretibial edema. Moderate erythema and mild swelling of the distal phalanx of the left toe. No fluctuance. No lymphangitic streaks.  Lymphadenopathy:    She has no cervical adenopathy.  Neurological: She is alert and oriented to person, place, and time. No cranial nerve deficit. She exhibits normal muscle tone. Coordination normal.  Skin: Skin is warm and dry. No rash noted.  Psychiatric: She has a normal mood and affect. Her behavior is normal. Judgment and thought content normal.  Nursing note and vitals reviewed.    ED Treatments / Results  DIAGNOSTIC STUDIES: Oxygen Saturation is 91% on RA, low by my interpretation.    COORDINATION OF CARE: 12:22 AM  Discussed treatment plan with pt at bedside and pt agreed to plan, which includes antibiotics.    Procedures Procedures (including critical care time)  Medications Ordered in ED Medications  cephALEXin (KEFLEX) capsule 1,000 mg (not administered)     Initial Impression / Assessment and Plan / ED Course  I have reviewed the triage vital signs and the nursing notes.  Swelling of the left first toe. This appears to () neck with spontaneous drainage. I am concerned that possibly may reaccumulate, but there is no drainable abscess currently. Epistaxis appears to be well controlled and does not require any treatment. Patient is advised how to manage recurrence. Peripheral edema is present and apparently chronic. Patient is advised on salt restriction. She is discharged with prescription for cephalexin. Return precautions discussed.  Final Clinical Impressions(s) / ED Diagnoses   Final diagnoses:  Cellulitis of great toe of left foot  Anterior epistaxis  Peripheral edema    New Prescriptions New Prescriptions   CEPHALEXIN (KEFLEX) 500 MG CAPSULE    Take 1 capsule (500 mg total) by mouth 4 (four) times daily.   I personally performed the services described in this documentation, which was scribed in my presence. The recorded information has been reviewed and is accurate.       Dione Boozeavid Amreen Raczkowski, MD 10/14/16 [phone]

## 2016-10-14 NOTE — Discharge Instructions (Signed)
Soak you foot in warm water several times a day. Take acetaminophen or ibuprofen as needed for pain.  Return if pain and swelling of the toe are getting worse - it might indicate you are developing an abscess which would need to be drained.

## 2017-01-08 ENCOUNTER — Emergency Department (HOSPITAL_COMMUNITY)
Admission: EM | Admit: 2017-01-08 | Discharge: 2017-01-08 | Disposition: A | Discharge status: Home / Self Care | Payer: Self-pay | Attending: Emergency Medicine | Admitting: Emergency Medicine

## 2017-01-08 ENCOUNTER — Encounter (HOSPITAL_COMMUNITY): Payer: Self-pay | Admitting: Emergency Medicine

## 2017-01-08 DIAGNOSIS — L0201 Cutaneous abscess of face: Secondary | ICD-10-CM | POA: Insufficient documentation

## 2017-01-08 DIAGNOSIS — N9489 Other specified conditions associated with female genital organs and menstrual cycle: Secondary | ICD-10-CM | POA: Insufficient documentation

## 2017-01-08 DIAGNOSIS — L0291 Cutaneous abscess, unspecified: Secondary | ICD-10-CM

## 2017-01-08 DIAGNOSIS — Z79899 Other long term (current) drug therapy: Secondary | ICD-10-CM | POA: Insufficient documentation

## 2017-01-08 DIAGNOSIS — Z23 Encounter for immunization: Secondary | ICD-10-CM | POA: Insufficient documentation

## 2017-01-08 DIAGNOSIS — R112 Nausea with vomiting, unspecified: Secondary | ICD-10-CM | POA: Insufficient documentation

## 2017-01-08 LAB — CBC WITH DIFFERENTIAL/PLATELET
BASOS ABS: 0 10*3/uL (ref 0.0–0.1)
BASOS PCT: 0 %
EOS PCT: 2 %
Eosinophils Absolute: 0.1 10*3/uL (ref 0.0–0.7)
HCT: 39.6 % (ref 36.0–46.0)
Hemoglobin: 12.9 g/dL (ref 12.0–15.0)
Lymphocytes Relative: 33 %
Lymphs Abs: 1.5 10*3/uL (ref 0.7–4.0)
MCH: 29 pg (ref 26.0–34.0)
MCHC: 32.6 g/dL (ref 30.0–36.0)
MCV: 89 fL (ref 78.0–100.0)
MONO ABS: 0.3 10*3/uL (ref 0.1–1.0)
MONOS PCT: 7 %
Neutro Abs: 2.7 10*3/uL (ref 1.7–7.7)
Neutrophils Relative %: 58 %
PLATELETS: 153 10*3/uL (ref 150–400)
RBC: 4.45 MIL/uL (ref 3.87–5.11)
RDW: 13.3 % (ref 11.5–15.5)
WBC: 4.6 10*3/uL (ref 4.0–10.5)

## 2017-01-08 LAB — COMPREHENSIVE METABOLIC PANEL
ALBUMIN: 3.9 g/dL (ref 3.5–5.0)
ALT: 13 U/L — ABNORMAL LOW (ref 14–54)
ANION GAP: 4 — AB (ref 5–15)
AST: 17 U/L (ref 15–41)
Alkaline Phosphatase: 52 U/L (ref 38–126)
BILIRUBIN TOTAL: 0.4 mg/dL (ref 0.3–1.2)
BUN: 10 mg/dL (ref 6–20)
CHLORIDE: 107 mmol/L (ref 101–111)
CO2: 31 mmol/L (ref 22–32)
Calcium: 11.2 mg/dL — ABNORMAL HIGH (ref 8.9–10.3)
Creatinine, Ser: 0.77 mg/dL (ref 0.44–1.00)
GFR calc Af Amer: >60 mL/min (ref >60)
GLUCOSE: 89 mg/dL (ref 65–99)
POTASSIUM: 4.5 mmol/L (ref 3.5–5.1)
Sodium: 142 mmol/L (ref 135–145)
TOTAL PROTEIN: 6.7 g/dL (ref 6.5–8.1)

## 2017-01-08 LAB — HCG, QUANTITATIVE, PREGNANCY

## 2017-01-08 MED ORDER — ONDANSETRON HCL 4 MG/2ML IJ SOLN
4.0000 mg | Freq: Once | INTRAMUSCULAR | Status: AC
  Administered 2017-01-08: 4 mg via INTRAVENOUS
  Filled 2017-01-08: qty 2

## 2017-01-08 MED ORDER — LIDOCAINE HCL (PF) 1 % IJ SOLN
INTRAMUSCULAR | Status: AC
  Filled 2017-01-08: qty 5

## 2017-01-08 MED ORDER — SODIUM CHLORIDE 0.9 % IV BOLUS (SEPSIS)
500.0000 mL | Freq: Once | INTRAVENOUS | Status: AC
  Administered 2017-01-08: 500 mL via INTRAVENOUS

## 2017-01-08 MED ORDER — TETANUS-DIPHTH-ACELL PERTUSSIS 5-2.5-18.5 LF-MCG/0.5 IM SUSP
0.5000 mL | Freq: Once | INTRAMUSCULAR | Status: AC
  Administered 2017-01-08: 0.5 mL via INTRAMUSCULAR
  Filled 2017-01-08: qty 0.5

## 2017-01-08 MED ORDER — SULFAMETHOXAZOLE-TRIMETHOPRIM 800-160 MG PO TABS
1.0000 | ORAL_TABLET | Freq: Once | ORAL | Status: AC
  Administered 2017-01-08: 1 via ORAL
  Filled 2017-01-08: qty 1

## 2017-01-08 MED ORDER — SULFAMETHOXAZOLE-TRIMETHOPRIM 800-160 MG PO TABS: 1.0000 | ORAL_TABLET | Freq: Two times a day (BID) | ORAL | 0 refills | Status: AC

## 2017-01-08 MED ORDER — PROMETHAZINE HCL 25 MG PO TABS: 25.0000 mg | ORAL_TABLET | Freq: Four times a day (QID) | ORAL | 0 refills | Status: AC | PRN

## 2017-01-08 NOTE — ED Provider Notes (Signed)
[facility] Provider Note   CSN: [id number] Arrival date & time: 01/08/17  1701     History   Chief Complaint Chief Complaint  Patient presents with  . Nausea  . Abscess    HPI [name] Koch is a 36 y.o. female.  Patient complains of nausea vomiting and an abscess to her left chin   The history is provided by the patient. No language interpreter was used.  Abscess  Location:  Face Abscess quality: draining   Red streaking: no   Progression:  Worsening Chronicity:  New Context: not diabetes   Associated symptoms: vomiting   Associated symptoms: no fatigue and no headaches     Past Medical History:  Diagnosis Date  . Kidney stone   . Kidney stone   . Migraine     There are no active problems to display for this patient.   Past Surgical History:  Procedure Laterality Date  . BREAST SURGERY    . CYSTOSCOPY WITH STENT PLACEMENT Left 09/06/2013   Procedure: CYSTOSCOPY WITH STENT PLACEMENT;  Surgeon: Ky Barban, MD;  Location: [facility];  Service: Urology;  Laterality: Left;  . TUBAL LIGATION    . TUBAL LIGATION      OB History    Gravida Para Term Preterm AB Living   2         2   SAB TAB Ectopic Multiple Live Births                   Home Medications    Prior to Admission medications   Medication Sig Start Date End Date Taking? Authorizing Provider  diphenhydramine-acetaminophen (TYLENOL PM) 25-500 MG TABS tablet Take 1-2 tablets by mouth at bedtime as needed (for sleep).   Yes [provider]  ibuprofen (ADVIL,MOTRIN) 200 MG tablet Take 200 mg by mouth every 6 (six) hours as needed for mild pain or moderate pain.   Yes [provider]  promethazine (PHENERGAN) 25 MG tablet Take 1 tablet (25 mg total) by mouth every 6 (six) hours as needed for nausea or vomiting. 01/08/17   Bethann Berkshire, MD  sulfamethoxazole-trimethoprim (BACTRIM DS,SEPTRA DS) 800-160 MG tablet Take 1 tablet by mouth 2 (two) times daily. 01/08/17 01/15/17   Bethann Berkshire, MD    Family History History reviewed. No pertinent family history.  Social History Social History  Substance Use Topics  . Smoking status: Never Smoker  . Smokeless tobacco: Never Used  . Alcohol use No     Allergies   Tylenol with codeine #3 [acetaminophen-codeine]   Review of Systems Review of Systems  Constitutional: Negative for appetite change and fatigue.  HENT: Negative for congestion, ear discharge and sinus pressure.   Eyes: Negative for discharge.  Respiratory: Negative for cough.   Cardiovascular: Negative for chest pain.  Gastrointestinal: Positive for vomiting. Negative for abdominal pain and diarrhea.  Genitourinary: Negative for frequency and hematuria.  Musculoskeletal: Negative for back pain.  Skin: Positive for rash.  Neurological: Negative for seizures and headaches.  Psychiatric/Behavioral: Negative for hallucinations.     Physical Exam Updated Vital Signs BP 125/72 (BP Location: Right Arm)   Pulse (!) 108   Temp 97.6 F (36.4 C) (Oral)   Resp 18   Ht 5\' 5"  (1.651 m)   Wt 145 lb (65.8 kg)   LMP 12/18/2016   SpO2 98%   BMI 24.13 kg/m   Physical Exam  Constitutional: She is oriented to person, place, and time. She appears well-developed.  HENT:  Head: Normocephalic.  Eyes: Conjunctivae and EOM are normal. No scleral icterus.  Neck: Neck supple. No thyromegaly present.  Cardiovascular: Normal rate and regular rhythm.  Exam reveals no gallop and no friction rub.   No murmur heard. Pulmonary/Chest: No stridor. She has no wheezes. She has no rales. She exhibits no tenderness.  Abdominal: She exhibits no distension. There is no tenderness. There is no rebound.  Musculoskeletal: Normal range of motion. She exhibits no edema.  Lymphadenopathy:    She has no cervical adenopathy.  Neurological: She is oriented to person, place, and time. She exhibits normal muscle tone. Coordination normal.  Skin: Rash noted. No erythema.    Small abscess to left chin  Psychiatric: She has a normal mood and affect. Her behavior is normal.     ED Treatments / Results  Labs (all labs ordered are listed, but only abnormal results are displayed) Labs Reviewed  COMPREHENSIVE METABOLIC PANEL - Abnormal; Notable for the following:       Result Value   Calcium 11.2 (*)    ALT 13 (*)    Anion gap 4 (*)    All other components within normal limits  CBC WITH DIFFERENTIAL/PLATELET  HCG, QUANTITATIVE, PREGNANCY  I-STAT BETA HCG BLOOD, ED ([facility], [facility], [facility] ONLY)    EKG  EKG Interpretation None       Radiology No results found.  Procedures .Marland Kitchen.Incision and Drainage Date/Time: 01/08/2017 8:13 PM Performed by: Bethann BerkshireZAMMIT, Almee Pelphrey Authorized by: Bethann BerkshireZAMMIT, Ellerie Arenz   Comments:     Patient has small abscess to her left chin. The area was cleaned with Betadine. It was numbed with 1% lidocaine without epi. A #11 blade was used to open the abscess. Small amount of pus was removed. The patient tolerated the procedure well   (including critical care time)  Medications Ordered in ED Medications  lidocaine (PF) (XYLOCAINE) 1 % injection (  Handoff 01/08/17 1853)  Tdap (BOOSTRIX) injection 0.5 mL (not administered)  sodium chloride 0.9 % bolus 500 mL (0 mLs Intravenous Stopped 01/08/17 1852)  ondansetron (ZOFRAN) injection 4 mg (4 mg Intravenous Given 01/08/17 1751)  sulfamethoxazole-trimethoprim (BACTRIM DS,SEPTRA DS) 800-160 MG per tablet 1 tablet (1 tablet Oral Given 01/08/17 1922)     Initial Impression / Assessment and Plan / ED Course  I have reviewed the triage vital signs and the nursing notes.  Pertinent labs & imaging results that were available during my care of the patient were reviewed by me and considered in my medical decision making (see chart for details).     Patient with nausea vomiting and abscess to her chin. Labs were unremarkable. Patient is feeling better. She'll be sent home with Phenergan and Bactrim and will  follow-up and to 3 days for recheck  Final Clinical Impressions(s) / ED Diagnoses   Final diagnoses:  Abscess    New Prescriptions New Prescriptions   PROMETHAZINE (PHENERGAN) 25 MG TABLET    Take 1 tablet (25 mg total) by mouth every 6 (six) hours as needed for nausea or vomiting.   SULFAMETHOXAZOLE-TRIMETHOPRIM (BACTRIM DS,SEPTRA DS) 800-160 MG TABLET    Take 1 tablet by mouth 2 (two) times daily.     Bethann BerkshireZammit, Dynesha Woolen, MD 01/08/17 2014

## 2017-01-08 NOTE — Discharge Instructions (Signed)
Follow up in 2-3 days for recheck °

## 2017-01-08 NOTE — ED Notes (Signed)
Pt reports a raised reddened area to her left jaw of two day. She reports that she at first thought it was a pimple and pushed it but notthing came out. 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 She reports that it has drained a bit yesterday when her nephew hit his head on the wound and today while she was washing hr face.   She also report emesis x 6 , per haps from her nerves.

## 2017-01-08 NOTE — ED Triage Notes (Signed)
PT c/o abscess with small amount of drainage to left side of face x3 days. PT also states today she has had nausea with vomiting x6 with no abdominal pain or urinary symptoms and states normal BM on 01/07/17.

## 2019-04-05 ENCOUNTER — Other Ambulatory Visit: Payer: Self-pay

## 2019-04-05 ENCOUNTER — Emergency Department (HOSPITAL_COMMUNITY): Payer: HRSA Program

## 2019-04-05 ENCOUNTER — Encounter (HOSPITAL_COMMUNITY): Payer: Self-pay | Admitting: Emergency Medicine

## 2019-04-05 ENCOUNTER — Emergency Department (HOSPITAL_COMMUNITY)
Admission: EM | Admit: 2019-04-05 | Discharge: 2019-04-05 | Disposition: A | Discharge status: Home / Self Care | Payer: HRSA Program | Attending: Emergency Medicine | Admitting: Emergency Medicine

## 2019-04-05 DIAGNOSIS — R0602 Shortness of breath: Secondary | ICD-10-CM | POA: Diagnosis present

## 2019-04-05 DIAGNOSIS — Z20828 Contact with and (suspected) exposure to other viral communicable diseases: Secondary | ICD-10-CM | POA: Insufficient documentation

## 2019-04-05 DIAGNOSIS — B349 Viral infection, unspecified: Secondary | ICD-10-CM | POA: Insufficient documentation

## 2019-04-05 DIAGNOSIS — Z79899 Other long term (current) drug therapy: Secondary | ICD-10-CM | POA: Diagnosis not present

## 2019-04-05 NOTE — Discharge Instructions (Signed)
You have been tested today for COVID-19.  Your results should be back within 24 hrs.  You may view the results in MyChart or someone from the hospital will notify you if results are positive.  You will need to self isolate until your results are back or you are 7 days without symptoms.  Return here for any worsening symptoms such as increasing shortness of breath.

## 2019-04-05 NOTE — ED Provider Notes (Signed)
[facility] EMERGENCY DEPARTMENT Provider Note   CSN: [id number] Arrival date & time: 04/05/19  1207     History   Chief Complaint No chief complaint on file.   HPI [name] Koch is a 38 y.o. female.     HPI   [name] Koch is a 38 y.o. female who presents to the Emergency Department complaining of mild shortness of breath, rhinorrhea, sore throat and generalized body aches.  Symptoms began this morning upon waking.  She states she went to work this morning and when she told her supervisor of her symptoms, she was told to come to the ER for COVID testing.  patient denies fever, chills, chest pain, loss of taste or smell, vomiting or diarrhea.  No known COVID exposures.      Past Medical History:  Diagnosis Date  . Kidney stone   . Kidney stone   . Migraine     There are no active problems to display for this patient.   Past Surgical History:  Procedure Laterality Date  . BREAST SURGERY    . CYSTOSCOPY WITH STENT PLACEMENT Left 09/06/2013   Procedure: CYSTOSCOPY WITH STENT PLACEMENT;  Surgeon: Ky BarbanMohammad I Javaid, MD;  Location: [facility];  Service: Urology;  Laterality: Left;  . TUBAL LIGATION    . TUBAL LIGATION       OB History    Gravida  2   Para      Term      Preterm      AB      Living  2     SAB      TAB      Ectopic      Multiple      Live Births               Home Medications    Prior to Admission medications   Medication Sig Start Date End Date Taking? Authorizing Provider  amphetamine-dextroamphetamine (ADDERALL) 20 MG tablet Take 20 mg by mouth 2 (two) times daily.  03/27/19  Yes [provider]  Buprenorphine HCl-Naloxone HCl 8-2 MG FILM Place 1 Film under the tongue 3 (three) times daily.  03/27/19 04/26/19 Yes [provider]  diphenhydramine-acetaminophen (TYLENOL PM) 25-500 MG TABS tablet Take 2 tablets by mouth at bedtime as needed (for sleep).    Yes [provider]  ibuprofen (ADVIL,MOTRIN) 200  MG tablet Take 600 mg by mouth every 6 (six) hours as needed for mild pain or moderate pain.    Yes [provider]  promethazine (PHENERGAN) 25 MG tablet Take 1 tablet (25 mg total) by mouth every 6 (six) hours as needed for nausea or vomiting. Patient not taking: Reported on 04/05/2019 01/08/17   Bethann BerkshireZammit, Joseph, MD    Family History No family history on file.  Social History Social History   Tobacco Use  . Smoking status: Never Smoker  . Smokeless tobacco: Never Used  Substance Use Topics  . Alcohol use: No  . Drug use: No    Comment: opiate use at [facility]     Allergies   Tylenol with codeine #3 [acetaminophen-codeine]   Review of Systems Review of Systems  Constitutional: Negative for activity change, appetite change, chills and fever.  HENT: Positive for rhinorrhea and sore throat. Negative for congestion, facial swelling and trouble swallowing.   Eyes: Negative for visual disturbance.  Respiratory: Positive for shortness of breath. Negative for cough, chest tightness, wheezing and stridor.   Cardiovascular: Negative for chest  pain.  Gastrointestinal: Negative for abdominal pain, nausea and vomiting.  Genitourinary: Negative for dysuria and flank pain.  Musculoskeletal: Positive for myalgias. Negative for neck pain and neck stiffness.  Skin: Negative for rash.  Neurological: Negative for dizziness, syncope, speech difficulty, weakness, numbness and headaches.  Hematological: Negative for adenopathy.  Psychiatric/Behavioral: Negative for confusion.     Physical Exam Updated Vital Signs BP (!) 117/55   Pulse 86   Temp 97.9 F (36.6 C) (Temporal)   Resp 16   Ht 5\' 5"  (1.651 m)   Wt 74.4 kg   LMP 04/05/2019 (Exact Date)   SpO2 100%   BMI 27.29 kg/m   Physical Exam Vitals signs and nursing note reviewed.  Constitutional:      General: She is not in acute distress.    Appearance: Normal appearance.  HENT:     Right Ear: Tympanic membrane and ear canal  normal.     Left Ear: Tympanic membrane and ear canal normal.     Mouth/Throat:     Mouth: Mucous membranes are moist.     Pharynx: Uvula midline. Posterior oropharyngeal erythema present. No pharyngeal swelling, oropharyngeal exudate or uvula swelling.     Comments: Mild erythema of the oropharynx.  No exudates.  Uvula is midline and non-edematous.  No peritonsillar or retropharyngeal abscess.  Neck:     Musculoskeletal: No muscular tenderness.  Cardiovascular:     Rate and Rhythm: Normal rate and regular rhythm.     Pulses: Normal pulses.  Pulmonary:     Effort: Pulmonary effort is normal. No respiratory distress.     Breath sounds: Normal breath sounds. No stridor. No wheezing or rhonchi.  Abdominal:     General: There is no distension.     Palpations: Abdomen is soft.     Tenderness: There is no abdominal tenderness.  Musculoskeletal: Normal range of motion.  Lymphadenopathy:     Cervical: No cervical adenopathy.  Skin:    General: Skin is warm.     Capillary Refill: Capillary refill takes less than 2 seconds.     Findings: No rash.  Neurological:     General: No focal deficit present.     Mental Status: She is alert.      ED Treatments / Results  Labs (all labs ordered are listed, but only abnormal results are displayed) Labs Reviewed  SARS CORONAVIRUS 2    EKG None  Radiology Dg Chest 2 View  Result Date: 04/05/2019 CLINICAL DATA:  Shortness of breath EXAM: CHEST - 2 VIEW COMPARISON:  July 21, 2012 FINDINGS: The lungs are clear. The heart size and pulmonary vascularity are normal. No adenopathy. No bone lesions. IMPRESSION: No edema or consolidation. Electronically Signed   By: Lowella Grip III M.D.   On: 04/05/2019 13:52    Procedures Procedures (including critical care time)  Medications Ordered in ED Medications - No data to display   Initial Impression / Assessment and Plan / ED Course  I have reviewed the triage vital signs and the nursing  notes.  Pertinent labs & imaging results that were available during my care of the patient were reviewed by me and considered in my medical decision making (see chart for details).        Pt with sx's beginning this morning, here because employer requested evaluation before she could return to work.  No fever.  Mild shortness of breath this morning, she is able to speak in full sentences w/o respiratory distress.  No hypoxia,  tachycardia or tachypnea. PERC negative.  COVID testing pending.  I feel that she is appropriate for d/c home.  She agrees to self isolate until test results are back and she is at least 7 days w/o sx's.  Return precautions discussed.   Final Clinical Impressions(s) / ED Diagnoses   Final diagnoses:  Viral illness    ED Discharge Orders    None       Rosey Bathriplett, Dalissa Lovin, PA-C 04/05/19 1720    Raeford RazorKohut, Stephen, MD 04/06/19 [phone]

## 2019-04-05 NOTE — ED Triage Notes (Signed)
Sore throat, shortness of breath and just feels bad in general.  Work place told pt she has to be seen at ED before returning to work.

## 2019-04-06 LAB — SARS CORONAVIRUS 2 (TAT 6-24 HRS): SARS Coronavirus 2: NEGATIVE

## 2020-01-31 ENCOUNTER — Other Ambulatory Visit: Payer: Self-pay

## 2020-05-29 ENCOUNTER — Other Ambulatory Visit: Payer: Self-pay

## 2020-05-29 DIAGNOSIS — Z20822 Contact with and (suspected) exposure to covid-19: Secondary | ICD-10-CM

## 2020-05-30 LAB — NOVEL CORONAVIRUS, NAA: SARS-CoV-2, NAA: NOT DETECTED

## 2020-05-30 LAB — SARS-COV-2, NAA 2 DAY TAT

## 2020-07-01 ENCOUNTER — Encounter (INDEPENDENT_AMBULATORY_CARE_PROVIDER_SITE_OTHER): Payer: Self-pay

## 2021-08-07 ENCOUNTER — Emergency Department (HOSPITAL_COMMUNITY)
Admission: EM | Admit: 2021-08-07 | Discharge: 2021-08-07 | Disposition: A | Discharge status: Home / Self Care | Payer: Self-pay | Attending: Emergency Medicine | Admitting: Emergency Medicine

## 2021-08-07 ENCOUNTER — Encounter (HOSPITAL_COMMUNITY): Payer: Self-pay | Admitting: *Deleted

## 2021-08-07 DIAGNOSIS — Y900 Blood alcohol level of less than 20 mg/100 ml: Secondary | ICD-10-CM | POA: Insufficient documentation

## 2021-08-07 DIAGNOSIS — F1910 Other psychoactive substance abuse, uncomplicated: Secondary | ICD-10-CM | POA: Insufficient documentation

## 2021-08-07 DIAGNOSIS — Z79899 Other long term (current) drug therapy: Secondary | ICD-10-CM | POA: Insufficient documentation

## 2021-08-07 LAB — CBC WITH DIFFERENTIAL/PLATELET
Abs Immature Granulocytes: 0.01 10*3/uL (ref 0.00–0.07)
Basophils Absolute: 0 10*3/uL (ref 0.0–0.1)
Basophils Relative: 1 %
Eosinophils Absolute: 0.1 10*3/uL (ref 0.0–0.5)
Eosinophils Relative: 1 %
HCT: 39.1 % (ref 36.0–46.0)
Hemoglobin: 12.5 g/dL (ref 12.0–15.0)
Immature Granulocytes: 0 %
Lymphocytes Relative: 15 %
Lymphs Abs: 0.8 10*3/uL (ref 0.7–4.0)
MCH: 28 pg (ref 26.0–34.0)
MCHC: 32 g/dL (ref 30.0–36.0)
MCV: 87.7 fL (ref 80.0–100.0)
Monocytes Absolute: 0.3 10*3/uL (ref 0.1–1.0)
Monocytes Relative: 5 %
Neutro Abs: 4 10*3/uL (ref 1.7–7.7)
Neutrophils Relative %: 78 %
Platelets: 261 10*3/uL (ref 150–400)
RBC: 4.46 MIL/uL (ref 3.87–5.11)
RDW: 14.2 % (ref 11.5–15.5)
WBC: 5.1 10*3/uL (ref 4.0–10.5)
nRBC: 0 % (ref 0.0–0.2)

## 2021-08-07 LAB — URINALYSIS, ROUTINE W REFLEX MICROSCOPIC
Bilirubin Urine: NEGATIVE
Glucose, UA: NEGATIVE mg/dL
Hgb urine dipstick: NEGATIVE
Ketones, ur: NEGATIVE mg/dL
Leukocytes,Ua: NEGATIVE
Nitrite: NEGATIVE
Protein, ur: NEGATIVE mg/dL
Specific Gravity, Urine: 1.01 (ref 1.005–1.030)
pH: 8 (ref 5.0–8.0)

## 2021-08-07 LAB — COMPREHENSIVE METABOLIC PANEL
ALT: 26 U/L (ref 0–44)
AST: 30 U/L (ref 15–41)
Albumin: 4.3 g/dL (ref 3.5–5.0)
Alkaline Phosphatase: 67 U/L (ref 38–126)
Anion gap: 9 (ref 5–15)
BUN: 13 mg/dL (ref 6–20)
CO2: 23 mmol/L (ref 22–32)
Calcium: 10.8 mg/dL — ABNORMAL HIGH (ref 8.9–10.3)
Chloride: 104 mmol/L (ref 98–111)
Creatinine, Ser: 0.92 mg/dL (ref 0.44–1.00)
GFR, Estimated: >60 mL/min (ref >60)
Glucose, Bld: 106 mg/dL — ABNORMAL HIGH (ref 70–99)
Potassium: 3.8 mmol/L (ref 3.5–5.1)
Sodium: 136 mmol/L (ref 135–145)
Total Bilirubin: 0.8 mg/dL (ref 0.3–1.2)
Total Protein: 8.1 g/dL (ref 6.5–8.1)

## 2021-08-07 LAB — RAPID URINE DRUG SCREEN, HOSP PERFORMED
Amphetamines: NOT DETECTED
Barbiturates: NOT DETECTED
Benzodiazepines: POSITIVE — AB
Cocaine: NOT DETECTED
Opiates: NOT DETECTED
Tetrahydrocannabinol: NOT DETECTED

## 2021-08-07 LAB — ETHANOL: Alcohol, Ethyl (B): <10 mg/dL (ref <10)

## 2021-08-07 MED ORDER — LORAZEPAM 2 MG/ML IJ SOLN
2.0000 mg | Freq: Once | INTRAMUSCULAR | Status: AC
  Administered 2021-08-07: 2 mg via INTRAVENOUS
  Filled 2021-08-07: qty 1

## 2021-08-07 MED ORDER — HALOPERIDOL LACTATE 5 MG/ML IJ SOLN
2.0000 mg | Freq: Once | INTRAMUSCULAR | Status: AC
  Administered 2021-08-07: 2 mg via INTRAVENOUS
  Filled 2021-08-07: qty 1

## 2021-08-07 MED ORDER — LORAZEPAM 2 MG/ML IJ SOLN
1.0000 mg | Freq: Once | INTRAMUSCULAR | Status: AC
  Administered 2021-08-07: 1 mg via INTRAVENOUS
  Filled 2021-08-07: qty 1

## 2021-08-07 MED ORDER — ONDANSETRON HCL 4 MG/2ML IJ SOLN
4.0000 mg | Freq: Once | INTRAMUSCULAR | Status: AC
  Administered 2021-08-07: 4 mg via INTRAVENOUS
  Filled 2021-08-07: qty 2

## 2021-08-07 MED ORDER — SODIUM CHLORIDE 0.9 % IV BOLUS
1000.0000 mL | Freq: Once | INTRAVENOUS | Status: AC
  Administered 2021-08-07: 1000 mL via INTRAVENOUS

## 2021-08-07 MED ORDER — HYDROXYZINE HCL 50 MG/ML IM SOLN
100.0000 mg | Freq: Once | INTRAMUSCULAR | Status: AC
  Administered 2021-08-07: 100 mg via INTRAMUSCULAR
  Filled 2021-08-07: qty 2

## 2021-08-07 NOTE — ED Provider Notes (Addendum)
[facility] EMERGENCY DEPARTMENT Provider Note   CSN: [id number] Arrival date & time: 08/07/21  1811     History Chief Complaint  Patient presents with   Overdose    [name] Koch is a 40 y.o. female.  Patient states she took some Klonopin Suboxone and snorted a white substance and then took Narcan.  She comes into the emergency department severely agitated  The history is provided by the patient and medical records. No language interpreter was used.  Drug Overdose This is a new problem. The current episode started 6 to 12 hours ago. The problem occurs rarely. The problem has not changed since onset.Pertinent negatives include no chest pain, no abdominal pain and no headaches. Nothing aggravates the symptoms. Nothing relieves the symptoms. She has tried nothing for the symptoms.      Past Medical History:  Diagnosis Date   Kidney stone    Kidney stone    Migraine     There are no problems to display for this patient.   Past Surgical History:  Procedure Laterality Date   BREAST SURGERY     CYSTOSCOPY WITH STENT PLACEMENT Left 09/06/2013   Procedure: CYSTOSCOPY WITH STENT PLACEMENT;  Surgeon: Ky Barban, MD;  Location: [facility];  Service: Urology;  Laterality: Left;   TUBAL LIGATION     TUBAL LIGATION       OB History     Gravida  2   Para      Term      Preterm      AB      Living  2      SAB      IAB      Ectopic      Multiple      Live Births              History reviewed. No pertinent family history.  Social History   Tobacco Use   Smoking status: Never   Smokeless tobacco: Never  Substance Use Topics   Alcohol use: No   Drug use: Yes    Comment: suboxone, heroin    Home Medications Prior to Admission medications   Medication Sig Start Date End Date Taking? Authorizing Provider  amphetamine-dextroamphetamine (ADDERALL) 20 MG tablet Take 20 mg by mouth 2 (two) times daily.  03/27/19   [provider]   diphenhydramine-acetaminophen (TYLENOL PM) 25-500 MG TABS tablet Take 2 tablets by mouth at bedtime as needed (for sleep).     [provider]  ibuprofen (ADVIL,MOTRIN) 200 MG tablet Take 600 mg by mouth every 6 (six) hours as needed for mild pain or moderate pain.     [provider]  promethazine (PHENERGAN) 25 MG tablet Take 1 tablet (25 mg total) by mouth every 6 (six) hours as needed for nausea or vomiting. Patient not taking: Reported on 04/05/2019 01/08/17   Bethann Berkshire, MD    Allergies    Tylenol with codeine #3 [acetaminophen-codeine]  Review of Systems   Review of Systems  Constitutional:  Negative for appetite change and fatigue.  HENT:  Negative for congestion, ear discharge and sinus pressure.   Eyes:  Negative for discharge.  Respiratory:  Negative for cough.   Cardiovascular:  Negative for chest pain.  Gastrointestinal:  Negative for abdominal pain and diarrhea.  Genitourinary:  Negative for frequency and hematuria.  Musculoskeletal:  Negative for back pain.  Skin:  Negative for rash.  Neurological:  Negative for seizures and headaches.  Psychiatric/Behavioral:  Positive for agitation. Negative for hallucinations.    Physical Exam Updated Vital Signs BP 119/75 (BP Location: Left Arm)   Pulse 83   Temp 98.7 F (37.1 C) (Oral)   Resp 20   Ht 5\' 5"  (1.651 m)   Wt 68 kg   LMP 07/31/2021   SpO2 92%   BMI 24.96 kg/m   Physical Exam Vitals and nursing note reviewed.  Constitutional:      Appearance: She is well-developed.  HENT:     Head: Normocephalic.     Nose: Nose normal.  Eyes:     General: No scleral icterus.    Conjunctiva/sclera: Conjunctivae normal.  Neck:     Thyroid: No thyromegaly.  Cardiovascular:     Rate and Rhythm: Normal rate and regular rhythm.     Heart sounds: No murmur heard.   No friction rub. No gallop.  Pulmonary:     Breath sounds: No stridor. No wheezing or rales.  Chest:     Chest wall: No tenderness.   Abdominal:     General: There is no distension.     Tenderness: There is no abdominal tenderness. There is no rebound.  Musculoskeletal:        General: Normal range of motion.     Cervical back: Neck supple.  Lymphadenopathy:     Cervical: No cervical adenopathy.  Skin:    Findings: No erythema or rash.  Neurological:     Mental Status: She is alert and oriented to person, place, and time.     Motor: No abnormal muscle tone.     Coordination: Coordination normal.  Psychiatric:     Comments: Patient has severe anxiety and shaking all over    ED Results / Procedures / Treatments   Labs (all labs ordered are listed, but only abnormal results are displayed) Labs Reviewed  COMPREHENSIVE METABOLIC PANEL - Abnormal; Notable for the following components:      Result Value   Glucose, Bld 106 (*)    Calcium 10.8 (*)    All other components within normal limits  RAPID URINE DRUG SCREEN, HOSP PERFORMED - Abnormal; Notable for the following components:   Benzodiazepines POSITIVE (*)    All other components within normal limits  CBC WITH DIFFERENTIAL/PLATELET  ETHANOL  URINALYSIS, ROUTINE W REFLEX MICROSCOPIC    EKG None  Radiology No results found.  Procedures Procedures   Medications Ordered in ED Medications  ondansetron (ZOFRAN) injection 4 mg (4 mg Intravenous Given 08/07/21 1835)  LORazepam (ATIVAN) injection 1 mg (1 mg Intravenous Given 08/07/21 1853)  hydrOXYzine (VISTARIL) injection 100 mg (100 mg Intramuscular Given 08/07/21 1935)  haloperidol lactate (HALDOL) injection 2 mg (2 mg Intravenous Given 08/07/21 2036)  sodium chloride 0.9 % bolus 1,000 mL (0 mLs Intravenous Stopped 08/07/21 2158)  LORazepam (ATIVAN) injection 2 mg (2 mg Intravenous Given 08/07/21 2158)    ED Course  I have reviewed the triage vital signs and the nursing notes.  Pertinent labs & imaging results that were available during my care of the patient were reviewed by me and considered in my  medical decision making (see chart for details). CRITICAL CARE Performed by: Milton Ferguson Total critical care time: 40 minutes Critical care time was exclusive of separately billable procedures and treating other patients. Critical care was necessary to treat or prevent imminent or life-threatening deterioration. Critical care was time spent personally by me on the following activities: development of treatment plan with patient and/or surrogate as well as nursing,  discussions with consultants, evaluation of patient's response to treatment, examination of patient, obtaining history from patient or surrogate, ordering and performing treatments and interventions, ordering and review of laboratory studies, ordering and review of radiographic studies, pulse oximetry and re-evaluation of patient's condition.    MDM Rules/Calculators/A&P                           Patient with substance abuse.  And reaction to Narcan.  Patient required numerous medications for anxiety.  After approximately 5 hours she did calm down and was back to normal.  Diagnosis substance abuse Final Clinical Impression(s) / ED Diagnoses Final diagnoses:  Substance abuse [facility])    Rx / [place] Orders ED Discharge Orders     None        Milton Ferguson, MD 08/08/21 1350    Milton Ferguson, MD 08/08/21 1350

## 2021-08-07 NOTE — ED Notes (Signed)
ED Provider at bedside. 

## 2021-08-07 NOTE — ED Triage Notes (Signed)
Patient arrived from EMS stating that she took Klonopin 2 1mg  , Suboxone, heroin and then gave herself 4 mg Narcan.  She states this was not intentional, that she was just trying to feel better and did not intend to hurt herself.  Denies suicidal ideations.  Alert, oriented and ale to articulate all that happened.

## 2021-08-07 NOTE — ED Notes (Signed)
Pt did not need assistance while standing, Pt stated it was hard standing for that period of time.

## 2021-08-07 NOTE — Discharge Instructions (Addendum)
Stop using drugs not prescribed to you.  Follow-up with your doctor if needed

## 2023-01-13 ENCOUNTER — Other Ambulatory Visit (HOSPITAL_COMMUNITY): Payer: Self-pay

## 2023-01-13 MED ORDER — MOUNJARO 2.5 MG/0.5ML ~~LOC~~ SOAJ
2.5000 mg | SUBCUTANEOUS | 0 refills | Status: AC
  Filled 2023-01-13 – 2023-01-18 (×2): qty 2, 28d supply, fill #0

## 2023-01-17 ENCOUNTER — Other Ambulatory Visit (HOSPITAL_BASED_OUTPATIENT_CLINIC_OR_DEPARTMENT_OTHER): Payer: Self-pay

## 2023-01-17 ENCOUNTER — Other Ambulatory Visit (HOSPITAL_COMMUNITY): Payer: Self-pay

## 2023-01-18 ENCOUNTER — Other Ambulatory Visit (HOSPITAL_COMMUNITY): Payer: Self-pay

## 2023-01-18 MED ORDER — ZEPBOUND 2.5 MG/0.5ML ~~LOC~~ SOAJ
2.5000 mg | SUBCUTANEOUS | 0 refills | Status: AC
  Filled 2023-01-18: qty 2, 28d supply, fill #0

## 2023-01-20 ENCOUNTER — Other Ambulatory Visit (HOSPITAL_COMMUNITY): Payer: Self-pay

## 2023-01-20 MED ORDER — AMPHETAMINE-DEXTROAMPHETAMINE 15 MG PO TABS
1.0000 | ORAL_TABLET | Freq: Every day | ORAL | 0 refills | Status: DC
  Filled 2023-01-20: qty 14, 14d supply, fill #0

## 2024-02-08 ENCOUNTER — Other Ambulatory Visit (HOSPITAL_COMMUNITY): Payer: Self-pay
